# Patient Record
Sex: Female | Born: 1995 | Hispanic: No | Marital: Married | Smoking: Never smoker
Health system: Southern US, Community
[De-identification: ages and names within clinical notes are randomized; demographics above are authoritative.]

## PROBLEM LIST (undated history)

## (undated) ENCOUNTER — Inpatient Hospital Stay (HOSPITAL_COMMUNITY): Payer: Self-pay

## (undated) DIAGNOSIS — Z789 Other specified health status: Secondary | ICD-10-CM

## (undated) HISTORY — PX: NO PAST SURGERIES: SHX2092

---
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
## 2014-09-22 ENCOUNTER — Encounter (HOSPITAL_COMMUNITY): Payer: Self-pay | Admitting: Emergency Medicine

## 2014-11-21 NOTE — L&D Delivery Note (Cosign Needed)
Delivery Note Pt was noted to be C/C/+3 around 2300.  After about a 20 minute 2nd stage, with NICU in attendance, at 11:41 PM a viable female was delivered via Vaginal, Spontaneous Delivery (Presentation: LOA ).  APGAR: 9, 10; weight pending. After 2 minutes, the cord was clamped and cut. 40 units of pitocin diluted in 1000cc LR was infused rapidly IV.  The placenta separated spontaneously and delivered via CCT and maternal pushing effort.  It was inspected and appears to be intact with a 3 VC. To pathology d/t mec.   .     Anesthesia: Epidural  Episiotomy:  none Lacerations:  none Suture Repair: n/a Est. Blood Loss (mL):  50  Mom to postpartum.  Baby to Couplet care / Skin to Skin.  CRESENZO-DISHMAN,Najia Hurlbutt 11/12/2015, 11:53 PM

## 2015-05-18 LAB — OB RESULTS CONSOLE GC/CHLAMYDIA
Chlamydia: NEGATIVE
GC PROBE AMP, GENITAL: NEGATIVE

## 2015-05-18 LAB — OB RESULTS CONSOLE HIV ANTIBODY (ROUTINE TESTING): HIV: NONREACTIVE

## 2015-05-18 LAB — OB RESULTS CONSOLE VARICELLA ZOSTER ANTIBODY, IGG: Varicella: IMMUNE

## 2015-05-18 LAB — SICKLE CELL SCREEN: Sickle Cell Screen: NORMAL

## 2015-05-18 LAB — OB RESULTS CONSOLE ANTIBODY SCREEN: Antibody Screen: NEGATIVE

## 2015-05-18 LAB — OB RESULTS CONSOLE PLATELET COUNT: Platelets: 277 10*3/uL

## 2015-05-18 LAB — OB RESULTS CONSOLE ABO/RH: RH Type: POSITIVE

## 2015-05-18 LAB — OB RESULTS CONSOLE RPR: RPR: NONREACTIVE

## 2015-05-18 LAB — OB RESULTS CONSOLE HGB/HCT, BLOOD
HEMATOCRIT: 42 %
HEMOGLOBIN: 12.6 g/dL

## 2015-05-18 LAB — OB RESULTS CONSOLE HEPATITIS B SURFACE ANTIGEN: HEP B S AG: NEGATIVE

## 2015-05-18 LAB — CYSTIC FIBROSIS DIAGNOSTIC STUDY: Interpretation-CFDNA:: NEGATIVE

## 2015-05-18 LAB — OB RESULTS CONSOLE RUBELLA ANTIBODY, IGM: RUBELLA: IMMUNE

## 2015-06-02 ENCOUNTER — Encounter: Payer: Self-pay | Admitting: *Deleted

## 2015-06-04 ENCOUNTER — Encounter: Payer: Medicaid Other | Admitting: Family Medicine

## 2015-06-12 ENCOUNTER — Other Ambulatory Visit: Payer: Self-pay | Admitting: Obstetrics & Gynecology

## 2015-06-12 DIAGNOSIS — Z3A18 18 weeks gestation of pregnancy: Secondary | ICD-10-CM

## 2015-06-12 DIAGNOSIS — Z3689 Encounter for other specified antenatal screening: Secondary | ICD-10-CM

## 2015-06-12 DIAGNOSIS — O09212 Supervision of pregnancy with history of pre-term labor, second trimester: Secondary | ICD-10-CM

## 2015-06-15 ENCOUNTER — Other Ambulatory Visit: Payer: Self-pay | Admitting: Obstetrics & Gynecology

## 2015-06-15 ENCOUNTER — Encounter (HOSPITAL_COMMUNITY): Payer: Self-pay

## 2015-06-15 ENCOUNTER — Ambulatory Visit (HOSPITAL_COMMUNITY)
Admission: RE | Admit: 2015-06-15 | Discharge: 2015-06-15 | Disposition: A | Discharge status: Home / Self Care | Payer: Medicaid Other | Source: Ambulatory Visit | Attending: Obstetrics & Gynecology | Admitting: Obstetrics & Gynecology

## 2015-06-15 DIAGNOSIS — Z8751 Personal history of pre-term labor: Secondary | ICD-10-CM | POA: Insufficient documentation

## 2015-06-15 DIAGNOSIS — Z3A18 18 weeks gestation of pregnancy: Secondary | ICD-10-CM

## 2015-06-15 DIAGNOSIS — Z3689 Encounter for other specified antenatal screening: Secondary | ICD-10-CM

## 2015-06-15 DIAGNOSIS — O09212 Supervision of pregnancy with history of pre-term labor, second trimester: Secondary | ICD-10-CM | POA: Diagnosis present

## 2015-06-15 DIAGNOSIS — Z36 Encounter for antenatal screening of mother: Secondary | ICD-10-CM | POA: Insufficient documentation

## 2015-06-15 DIAGNOSIS — O09213 Supervision of pregnancy with history of pre-term labor, third trimester: Secondary | ICD-10-CM | POA: Insufficient documentation

## 2015-06-22 ENCOUNTER — Other Ambulatory Visit: Payer: Self-pay | Admitting: Obstetrics & Gynecology

## 2015-06-22 DIAGNOSIS — Z3403 Encounter for supervision of normal first pregnancy, third trimester: Secondary | ICD-10-CM

## 2015-06-22 DIAGNOSIS — O09213 Supervision of pregnancy with history of pre-term labor, third trimester: Secondary | ICD-10-CM

## 2015-06-22 DIAGNOSIS — Z3A20 20 weeks gestation of pregnancy: Secondary | ICD-10-CM

## 2015-06-22 DIAGNOSIS — Z3A18 18 weeks gestation of pregnancy: Secondary | ICD-10-CM

## 2015-06-22 DIAGNOSIS — O09293 Supervision of pregnancy with other poor reproductive or obstetric history, third trimester: Secondary | ICD-10-CM

## 2015-06-25 ENCOUNTER — Encounter: Payer: Self-pay | Admitting: Obstetrics & Gynecology

## 2015-06-25 ENCOUNTER — Ambulatory Visit (INDEPENDENT_AMBULATORY_CARE_PROVIDER_SITE_OTHER): Payer: Medicaid Other | Admitting: Obstetrics & Gynecology

## 2015-06-25 VITALS — BP 98/60 | HR 78 | Temp 97.9°F | Wt 111.9 lb

## 2015-06-25 DIAGNOSIS — O09212 Supervision of pregnancy with history of pre-term labor, second trimester: Secondary | ICD-10-CM

## 2015-06-25 LAB — POCT URINALYSIS DIP (DEVICE)
BILIRUBIN URINE: NEGATIVE
Glucose, UA: NEGATIVE mg/dL
Ketones, ur: NEGATIVE mg/dL
Leukocytes, UA: NEGATIVE
Nitrite: NEGATIVE
PH: 7 (ref 5.0–8.0)
Protein, ur: NEGATIVE mg/dL
Specific Gravity, Urine: 1.01 (ref 1.005–1.030)
UROBILINOGEN UA: 0.2 mg/dL (ref 0.0–1.0)

## 2015-06-25 MED ORDER — PRENATAL VITAMINS PLUS 27-1 MG PO TABS: 1.0000 | ORAL_TABLET | Freq: Every day | ORAL | Status: DC

## 2015-06-25 MED ORDER — HYDROXYPROGESTERONE CAPROATE 250 MG/ML IM OIL
250.0000 mg | TOPICAL_OIL | Freq: Once | INTRAMUSCULAR | Status: AC
  Administered 2015-07-09: 250 mg via INTRAMUSCULAR

## 2015-06-25 NOTE — Patient Instructions (Signed)
Preterm Birth °Preterm birth is a birth that happens before 37 weeks of pregnancy. Most pregnancies last about 39-41 weeks. Every week in the womb is important and is beneficial to the health of the infant. Infants born before 37 weeks of pregnancy are at a higher risk for complications. Depending on when the infant was born, he or she may be: °· Late preterm. Born between 32 weeks and 37 weeks of pregnancy. °· Very preterm. Born at less than 32 weeks of pregnancy. °· Extremely preterm. Born at less than 25 weeks of pregnancy. °The earlier a baby is born, the more likely the child will have issues related to prematurity. Complications and problems that can be seen in infants born too early include: °· Problems breathing (respiratory distress syndrome). °· Low birth weight. °· Problems feeding. °· Sleeping problems. °· Yellowing of the skin (jaundice). °· Infections such as pneumonia.  °Babies born very preterm or extremely preterm are at risk for more serious medical issues. These include: °· More severe breathing issues. °· Eyesight issues. °· Brain development issues (intraventricular hemorrhage). °· Behavioral and emotional development issues. °· Growth and developmental delays. °· Cerebral palsy. °· Serious feeding or bowel complications (necrotizing enterocolitis). °CAUSES  °There are two broad categories of preterm birth. °· Spontaneous preterm birth. This is a birth resulting from preterm labor (not medically induced) or preterm premature rupture of membranes (PPROM). °· Indicated preterm birth. This is a birth resulting from labor being medically induced due to health, personal, or social reasons. °RISK FACTORS °Preterm birth may be related to certain medical conditions, lifestyle factors, or demographic factors encountered by the mother or fetus. °· Medical conditions include: °¨ Multiple gestations (twins, triplets, and so on). °¨ Infection. °¨ Diabetes. °¨ Heart disease. °¨ Kidney disease. °¨ Cervical or  uterine abnormalities. °¨ Being underweight. °¨ High blood pressure or preeclampsia. °¨ Premature rupture of membranes (PROM). °¨ Birth defects in the fetus. °· Lifestyle factors include: °¨ Poor prenatal care. °¨ Poor nutrition or anemia. °¨ Cigarette smoking. °¨ Consuming alcohol. °¨ High levels of stress and lack of social or emotional support. °¨ Exposure to chemical or environmental toxins. °¨ Substance abuse. °· Demographic factors include: °¨ African-American ethnicity. °¨ Age (younger than 18 or older than 20 years of age). °¨ Low socioeconomic status. °Women with a history of preterm labor or who become pregnant within 18 months of giving birth are also at increased risk for preterm birth. °DIAGNOSIS  °Your health care provider may request additional tests to diagnose underlying complications resulting from preterm birth. Tests on the infant may include: °· Physical exam. °· Blood tests. °· Chest X-rays. °· Heart-lung monitoring. °TREATMENT  °After birth, special care will be taken to assess any problems or complications for the infant. Supportive care will be provided for the infant. Treatment depends on what problems are present and any complications that develop. Some preterm infants are cared for in a neonatal intensive care unit. In general, care may include: °· Maintaining temperature and oxygen in a clear heated box (baby isolette). °· Monitoring the infant's heart rate, breathing, and level of oxygen in the blood. °· Monitoring for signs of infection and, if needed, giving IV antibiotic medicine. °· Inserting a feeding tube (nose, mouth) or giving IV nutrition if unable to feed. °· Inserting a breathing tube (ventilation). °· Respiration support (continuous positive airway pressure [CPAP] or oxygen).  °Treatment will change as the infant builds up strength and is able to breathe and eat on his or her   own. For some infants, no special treatment is necessary. Parents may be educated on the potential  health risks of prematurity to the infant. °HOME CARE INSTRUCTIONS °· Understand your infant's special conditions and needs. It may be reassuring to learn about infant CPR. °· Monitor your infant in the car seat until he or she grows and matures. Infant car seats can cause breathing difficulties for preterm infants. °· Keep your infant warm. Dress your infant in layers and keep him or her away from drafts, especially in cold months of the year. °· Wash your hands thoroughly after going to the bathroom or changing a diaper. Late preterm infants may be more prone to infection. °· Follow all your health care provider's instructions for providing support and care to your preterm infant. °· Get support from organizations and groups that understand your challenges. °· Follow up with your infant's health care provider as directed. °Prevention °There are some things you can do to help lower your risk of having a preterm infant in the future. These include: °· Good prenatal care throughout the entire pregnancy. See a health care provider regularly for advice and tests. °· Management of underlying medical conditions. °· Proper self-care and lifestyle changes. °· Proper diet and weight control. °· Watching for signs of various infections. °SEEK MEDICAL CARE IF: °· Your infant has feeding difficulties. °· Your infant has sleeping difficulties. °· Your infant has breathing difficulties. °· Your infant's skin starts to look yellow. °· Your infant shows signs of infection, such as a stuffy nose, fever, crying, or bluish color of the skin. °FOR MORE INFORMATION °[employer] of [employer]: www.marchofdimes.com °Prematurity.org: www.prematurity.org °Document Released: 01/28/2004 Document Revised: 08/28/2013 Document Reviewed: 06/06/2013 °ExitCare® Patient Information ©2015 [employer]. This information is not intended to replace advice given to you by your health care provider. Make sure you discuss any questions you have with your health  care provider. ° °

## 2015-06-25 NOTE — Progress Notes (Signed)
Here for first visit, transferring from health department. Used Copywriter, advertising.  Given new patient booklet.

## 2015-06-25 NOTE — Progress Notes (Signed)
Nutrition Note: 1st visit.  Wt gain wnl.  Diet appears adequate.  PNV daily. Reports 3 meals and 2-3 snacks/day.  Drinks water, juice, and milk.   No N/V reported. Plans to BF. Receives [facility]. Verbal general nutrition education for pregnancy given through interpreter. F/U as needed. Candice C. Earlene Plater, MPH, RD, LDN

## 2015-06-25 NOTE — Progress Notes (Signed)
   Subjective:transfer from HD    [name] is a G2P0101 [redacted]w[redacted]d being seen today for her first obstetrical visit.  Her obstetrical history is significant for preterm delivery 2012 33 weeks. Patient does intend to breast feed. Pregnancy history fully reviewed.  Patient reports no complaints.  Filed Vitals:   06/25/15 0801  BP: 98/60  Pulse: 78  Temp: 97.9 F (36.6 C)  Weight: 111 lb 14.4 oz (50.758 kg)    HISTORY: OB History  Gravida Para Term Preterm AB SAB TAB Ectopic Multiple Living     # Outcome Date GA Lbr Len/2nd Weight Sex Delivery Anes PTL Lv  2 Current           1 Preterm 07/06/11 [redacted]w[redacted]d 42:40 / 01:52  F Vag-Spont None  Y     Past Medical History  Diagnosis Date  . No pertinent past medical history    Past Surgical History  Procedure Laterality Date  . No past surgeries     History reviewed. No pertinent family history.   Exam    Uterus:     Pelvic Exam:                                    Skin: normal coloration and turgor, no rashes    Neurologic: oriented, normal, normal mood   Extremities: normal strength, tone, and muscle mass       Mouth/Teeth dental hygiene good   Neck supple and no masses   Cardiovascular: regular rate and rhythm   Respiratory:  appears well, vitals normal, no respiratory distress, acyanotic, normal RR   Abdomen: soft, non-tender; bowel sounds normal; no masses,  no organomegaly          Assessment:    Pregnancy: [id number] Patient Active Problem List   Diagnosis Date Noted  . [redacted] weeks gestation of pregnancy   . Previous preterm delivery in second trimester, antepartum   Candidate for 17 P injections, this was explained via interpreter       Plan:     Initial labs reviewed Prenatal vitamins. Problem list reviewed and updated. Genetic Screening discussed Quad Screen: results reviewed.normal  Ultrasound discussed; fetal survey: ordered.  Follow up in 4 weeks. 50% of 30 min visit spent on  counseling and coordination of care.  17 P paperwork, see SW re transportation problems   Melecio Cueto 06/25/2015

## 2015-06-26 ENCOUNTER — Encounter: Payer: Self-pay | Admitting: *Deleted

## 2015-06-26 NOTE — Progress Notes (Signed)
FMLA completed for spouse to bring patient to appointments. Message left at patient's home informing her that paperwork is completed and ready for pick up.

## 2015-06-30 ENCOUNTER — Ambulatory Visit (HOSPITAL_COMMUNITY)
Admission: RE | Admit: 2015-06-30 | Discharge: 2015-06-30 | Disposition: A | Discharge status: Home / Self Care | Payer: Medicaid Other | Source: Ambulatory Visit | Attending: Obstetrics & Gynecology | Admitting: Obstetrics & Gynecology

## 2015-06-30 ENCOUNTER — Other Ambulatory Visit: Payer: Self-pay | Admitting: Obstetrics & Gynecology

## 2015-06-30 ENCOUNTER — Encounter (HOSPITAL_COMMUNITY): Payer: Self-pay

## 2015-06-30 DIAGNOSIS — O09213 Supervision of pregnancy with history of pre-term labor, third trimester: Secondary | ICD-10-CM

## 2015-06-30 DIAGNOSIS — Z3A18 18 weeks gestation of pregnancy: Secondary | ICD-10-CM

## 2015-06-30 DIAGNOSIS — O09293 Supervision of pregnancy with other poor reproductive or obstetric history, third trimester: Secondary | ICD-10-CM | POA: Diagnosis not present

## 2015-06-30 DIAGNOSIS — Z3403 Encounter for supervision of normal first pregnancy, third trimester: Secondary | ICD-10-CM

## 2015-07-02 ENCOUNTER — Ambulatory Visit: Payer: Medicaid Other

## 2015-07-09 ENCOUNTER — Ambulatory Visit (INDEPENDENT_AMBULATORY_CARE_PROVIDER_SITE_OTHER): Payer: Medicaid Other

## 2015-07-09 VITALS — BP 95/59 | HR 80 | Temp 97.7°F | Resp 16 | Wt 112.2 lb

## 2015-07-09 DIAGNOSIS — O09892 Supervision of other high risk pregnancies, second trimester: Secondary | ICD-10-CM

## 2015-07-09 DIAGNOSIS — O09212 Supervision of pregnancy with history of pre-term labor, second trimester: Secondary | ICD-10-CM

## 2015-07-13 ENCOUNTER — Other Ambulatory Visit: Payer: Self-pay | Admitting: Obstetrics & Gynecology

## 2015-07-13 ENCOUNTER — Encounter (HOSPITAL_COMMUNITY): Payer: Self-pay

## 2015-07-13 ENCOUNTER — Ambulatory Visit (HOSPITAL_COMMUNITY)
Admission: RE | Admit: 2015-07-13 | Discharge: 2015-07-13 | Disposition: A | Discharge status: Home / Self Care | Payer: Medicaid Other | Source: Ambulatory Visit | Attending: Obstetrics & Gynecology | Admitting: Obstetrics & Gynecology

## 2015-07-13 VITALS — BP 98/65 | HR 100 | Wt 116.6 lb

## 2015-07-13 DIAGNOSIS — O09293 Supervision of pregnancy with other poor reproductive or obstetric history, third trimester: Secondary | ICD-10-CM

## 2015-07-13 DIAGNOSIS — Z3A20 20 weeks gestation of pregnancy: Secondary | ICD-10-CM | POA: Diagnosis not present

## 2015-07-13 DIAGNOSIS — O09212 Supervision of pregnancy with history of pre-term labor, second trimester: Secondary | ICD-10-CM

## 2015-07-13 DIAGNOSIS — IMO0002 Reserved for concepts with insufficient information to code with codable children: Secondary | ICD-10-CM

## 2015-07-13 DIAGNOSIS — Z3403 Encounter for supervision of normal first pregnancy, third trimester: Secondary | ICD-10-CM

## 2015-07-13 DIAGNOSIS — Z0489 Encounter for examination and observation for other specified reasons: Secondary | ICD-10-CM

## 2015-07-13 DIAGNOSIS — Z36 Encounter for antenatal screening of mother: Secondary | ICD-10-CM | POA: Insufficient documentation

## 2015-07-13 DIAGNOSIS — O09213 Supervision of pregnancy with history of pre-term labor, third trimester: Secondary | ICD-10-CM

## 2015-07-16 ENCOUNTER — Ambulatory Visit: Payer: Medicaid Other | Admitting: *Deleted

## 2015-07-16 DIAGNOSIS — Z3490 Encounter for supervision of normal pregnancy, unspecified, unspecified trimester: Secondary | ICD-10-CM

## 2015-07-16 MED ORDER — HYDROXYPROGESTERONE CAPROATE 250 MG/ML IM OIL
250.0000 mg | TOPICAL_OIL | Freq: Once | INTRAMUSCULAR | Status: AC
  Administered 2015-07-23: 250 mg via INTRAMUSCULAR

## 2015-07-23 ENCOUNTER — Ambulatory Visit (INDEPENDENT_AMBULATORY_CARE_PROVIDER_SITE_OTHER): Payer: Medicaid Other | Admitting: Family Medicine

## 2015-07-23 VITALS — BP 94/59 | HR 77 | Temp 98.0°F | Wt 118.2 lb

## 2015-07-23 DIAGNOSIS — O09212 Supervision of pregnancy with history of pre-term labor, second trimester: Secondary | ICD-10-CM | POA: Diagnosis not present

## 2015-07-23 DIAGNOSIS — O0992 Supervision of high risk pregnancy, unspecified, second trimester: Secondary | ICD-10-CM | POA: Diagnosis not present

## 2015-07-23 DIAGNOSIS — Z23 Encounter for immunization: Secondary | ICD-10-CM

## 2015-07-23 DIAGNOSIS — O0990 Supervision of high risk pregnancy, unspecified, unspecified trimester: Secondary | ICD-10-CM | POA: Insufficient documentation

## 2015-07-23 LAB — POCT URINALYSIS DIP (DEVICE)
BILIRUBIN URINE: NEGATIVE
Glucose, UA: NEGATIVE mg/dL
HGB URINE DIPSTICK: NEGATIVE
KETONES UR: NEGATIVE mg/dL
LEUKOCYTES UA: NEGATIVE
NITRITE: NEGATIVE
Protein, ur: NEGATIVE mg/dL
Specific Gravity, Urine: 1.015 (ref 1.005–1.030)
Urobilinogen, UA: 0.2 mg/dL (ref 0.0–1.0)
pH: 7.5 (ref 5.0–8.0)

## 2015-07-23 MED ORDER — HYDROXYPROGESTERONE CAPROATE 250 MG/ML IM OIL: 250.0000 mg | TOPICAL_OIL | Freq: Once | INTRAMUSCULAR | Status: DC

## 2015-07-23 NOTE — Progress Notes (Signed)
Subjective:  [name] is a 19 y.o. G2P0101 at [redacted]w[redacted]d being seen today for ongoing prenatal care.  Patient reports no complaints.  Contractions: Not present.  Vag. Bleeding: None. Movement: Present. Denies leaking of fluid.   The following portions of the patient's history were reviewed and updated as appropriate: allergies, current medications, past family history, past medical history, past social history, past surgical history and problem list.   Objective:   Filed Vitals:   07/23/15 0935  BP: 94/59  Pulse: 77  Temp: 98 F (36.7 C)  Weight: 118 lb 3.2 oz (53.615 kg)    Fetal Status: Fetal Heart Rate (bpm): 150   Movement: Present     General:  Alert, oriented and cooperative. Patient is in no acute distress.  Skin: Skin is warm and dry. No rash noted.   Cardiovascular: Normal heart rate noted  Respiratory: Normal respiratory effort, no problems with respiration noted  Abdomen: Soft, gravid, appropriate for gestational age. Pain/Pressure: Absent     Pelvic: Vag. Bleeding: None     Cervical exam deferred        Extremities: Normal range of motion.  Edema: None  Mental Status: Normal mood and affect. Normal behavior. Normal judgment and thought content.   Urinalysis:      Assessment and Plan:  Pregnancy: G2P0101 at [redacted]w[redacted]d  1. Previous preterm delivery in second trimester, antepartum Continue Makena - Flu Vaccine QUAD 36+ mos IM; Standing - Flu Vaccine QUAD 36+ mos IM  2. Needs flu shot - Flu Vaccine QUAD 36+ mos IM; Standing - Flu Vaccine QUAD 36+ mos IM  3. Supervision of high risk pregnancy, antepartum, second trimester FHT and FH normal  Preterm labor symptoms and general obstetric precautions including but not limited to vaginal bleeding, contractions, leaking of fluid and fetal movement were reviewed in detail with the patient. Please refer to After Visit Summary for other counseling recommendations.  No Follow-up on file.   Levie Heritage, DO

## 2015-07-23 NOTE — Patient Instructions (Signed)
Second Trimester of Pregnancy The second trimester is from week 13 through week 28, month 4 through 6. This is often the time in pregnancy that you feel your best. Often times, morning sickness has lessened or quit. You may have more energy, and you may get hungry more often. Your unborn baby (fetus) is growing rapidly. At the end of the sixth month, he or she is about 9 inches long and weighs about 1 pounds. You will likely feel the baby move (quickening) between 18 and 20 weeks of pregnancy. HOME CARE   Avoid all smoking, herbs, and alcohol. Avoid drugs not approved by your doctor.  Only take medicine as told by your doctor. Some medicines are safe and some are not during pregnancy.  Exercise only as told by your doctor. Stop exercising if you start having cramps.  Eat regular, healthy meals.  Wear a good support bra if your breasts are tender.  Do not use hot tubs, steam rooms, or saunas.  Wear your seat belt when driving.  Avoid raw meat, uncooked cheese, and liter boxes and soil used by cats.  Take your prenatal vitamins.  Try taking medicine that helps you poop (stool softener) as needed, and if your doctor approves. Eat more fiber by eating fresh fruit, vegetables, and whole grains. Drink enough fluids to keep your pee (urine) clear or pale yellow.  Take warm water baths (sitz baths) to soothe pain or discomfort caused by hemorrhoids. Use hemorrhoid cream if your doctor approves.  If you have puffy, bulging veins (varicose veins), wear support hose. Raise (elevate) your feet for 15 minutes, 3-4 times a day. Limit salt in your diet.  Avoid heavy lifting, wear low heals, and sit up straight.  Rest with your legs raised if you have leg cramps or low back pain.  Visit your dentist if you have not gone during your pregnancy. Use a soft toothbrush to brush your teeth. Be gentle when you floss.  You can have sex (intercourse) unless your doctor tells you not to.  Go to your  doctor visits. GET HELP IF:   You feel dizzy.  You have mild cramps or pressure in your lower belly (abdomen).  You have a nagging pain in your belly area.  You continue to feel sick to your stomach (nauseous), throw up (vomit), or have watery poop (diarrhea).  You have bad smelling fluid coming from your vagina.  You have pain with peeing (urination). GET HELP RIGHT AWAY IF:   You have a fever.  You are leaking fluid from your vagina.  You have spotting or bleeding from your vagina.  You have severe belly cramping or pain.  You lose or gain weight rapidly.  You have trouble catching your breath and have chest pain.  You notice sudden or extreme puffiness (swelling) of your face, hands, ankles, feet, or legs.  You have not felt the baby move in over an hour.  You have severe headaches that do not go away with medicine.  You have vision changes. Document Released: 02/01/2010 Document Revised: 03/04/2013 Document Reviewed: 01/08/2013 ExitCare Patient Information 2015 ExitCare, [employer]. This information is not intended to replace advice given to you by your health care provider. Make sure you discuss any questions you have with your health care provider.  

## 2015-07-28 ENCOUNTER — Other Ambulatory Visit (HOSPITAL_COMMUNITY): Payer: Self-pay | Admitting: Maternal and Fetal Medicine

## 2015-07-28 ENCOUNTER — Ambulatory Visit (HOSPITAL_COMMUNITY)
Admission: RE | Admit: 2015-07-28 | Discharge: 2015-07-28 | Disposition: A | Discharge status: Home / Self Care | Payer: Medicaid Other | Source: Ambulatory Visit | Attending: Obstetrics & Gynecology | Admitting: Obstetrics & Gynecology

## 2015-07-28 ENCOUNTER — Encounter (HOSPITAL_COMMUNITY): Payer: Self-pay

## 2015-07-28 DIAGNOSIS — Z3A22 22 weeks gestation of pregnancy: Secondary | ICD-10-CM

## 2015-07-28 DIAGNOSIS — O09212 Supervision of pregnancy with history of pre-term labor, second trimester: Secondary | ICD-10-CM | POA: Diagnosis not present

## 2015-07-30 ENCOUNTER — Ambulatory Visit: Payer: Medicaid Other

## 2015-07-30 DIAGNOSIS — O09212 Supervision of pregnancy with history of pre-term labor, second trimester: Secondary | ICD-10-CM

## 2015-07-30 MED ORDER — HYDROXYPROGESTERONE CAPROATE 250 MG/ML IM OIL
250.0000 mg | TOPICAL_OIL | INTRAMUSCULAR | Status: AC
  Administered 2015-08-06 – 2015-10-26 (×10): 250 mg via INTRAMUSCULAR

## 2015-07-30 NOTE — Progress Notes (Signed)
Contacted Jearld Lesch from [employer], for a refill on 17p.  Was informed medication will be sent on Friday or Monday.  Will contact pt when medication arrives to the office.  Pt requests to be called @ [phone].

## 2015-07-31 ENCOUNTER — Telehealth: Payer: Self-pay | Admitting: General Practice

## 2015-07-31 NOTE — Telephone Encounter (Signed)
Telephone call to patient regarding arrival of 17p. Called patient with [employer] interpreter [phone] and informed patient of arrival 17p and appt next Thursday. Patient verbalized understanding and had no questions

## 2015-08-06 ENCOUNTER — Ambulatory Visit (INDEPENDENT_AMBULATORY_CARE_PROVIDER_SITE_OTHER): Payer: Medicaid Other | Admitting: *Deleted

## 2015-08-06 DIAGNOSIS — O09212 Supervision of pregnancy with history of pre-term labor, second trimester: Secondary | ICD-10-CM

## 2015-08-10 ENCOUNTER — Ambulatory Visit (HOSPITAL_COMMUNITY)
Admission: RE | Admit: 2015-08-10 | Discharge: 2015-08-10 | Disposition: A | Discharge status: Home / Self Care | Payer: Medicaid Other | Source: Ambulatory Visit | Attending: Obstetrics & Gynecology | Admitting: Obstetrics & Gynecology

## 2015-08-10 ENCOUNTER — Other Ambulatory Visit (HOSPITAL_COMMUNITY): Payer: Self-pay | Admitting: Maternal and Fetal Medicine

## 2015-08-10 ENCOUNTER — Encounter (HOSPITAL_COMMUNITY): Payer: Self-pay

## 2015-08-10 DIAGNOSIS — Z3A24 24 weeks gestation of pregnancy: Secondary | ICD-10-CM | POA: Insufficient documentation

## 2015-08-10 DIAGNOSIS — O09212 Supervision of pregnancy with history of pre-term labor, second trimester: Secondary | ICD-10-CM | POA: Diagnosis not present

## 2015-08-13 ENCOUNTER — Ambulatory Visit (INDEPENDENT_AMBULATORY_CARE_PROVIDER_SITE_OTHER): Payer: Medicaid Other

## 2015-08-13 VITALS — BP 115/75 | HR 67 | Wt 123.5 lb

## 2015-08-13 DIAGNOSIS — O09212 Supervision of pregnancy with history of pre-term labor, second trimester: Secondary | ICD-10-CM | POA: Diagnosis present

## 2015-08-13 MED ORDER — PRENATAL VITAMINS PLUS 27-1 MG PO TABS: 1.0000 | ORAL_TABLET | Freq: Every day | ORAL | Status: DC

## 2015-08-20 ENCOUNTER — Ambulatory Visit (INDEPENDENT_AMBULATORY_CARE_PROVIDER_SITE_OTHER): Payer: Medicaid Other | Admitting: Obstetrics & Gynecology

## 2015-08-20 VITALS — BP 96/66 | HR 101 | Temp 97.8°F | Wt 123.2 lb

## 2015-08-20 DIAGNOSIS — O09212 Supervision of pregnancy with history of pre-term labor, second trimester: Secondary | ICD-10-CM

## 2015-08-20 DIAGNOSIS — O0992 Supervision of high risk pregnancy, unspecified, second trimester: Secondary | ICD-10-CM | POA: Diagnosis not present

## 2015-08-20 LAB — POCT URINALYSIS DIP (DEVICE)
BILIRUBIN URINE: NEGATIVE
GLUCOSE, UA: NEGATIVE mg/dL
HGB URINE DIPSTICK: NEGATIVE
KETONES UR: NEGATIVE mg/dL
LEUKOCYTES UA: NEGATIVE
Nitrite: NEGATIVE
Protein, ur: NEGATIVE mg/dL
SPECIFIC GRAVITY, URINE: 1.015 (ref 1.005–1.030)
UROBILINOGEN UA: 0.2 mg/dL (ref 0.0–1.0)
pH: 7 (ref 5.0–8.0)

## 2015-08-20 NOTE — Patient Instructions (Signed)
Preterm Labor Information °Preterm labor is when labor starts at less than 37 weeks of pregnancy. The normal length of a pregnancy is 39 to 41 weeks. °CAUSES °Often, there is no identifiable underlying cause as to why a woman goes into preterm labor. One of the most common known causes of preterm labor is infection. Infections of the uterus, cervix, vagina, amniotic sac, bladder, kidney, or even the lungs (pneumonia) can cause labor to start. Other suspected causes of preterm labor include:  °· Urogenital infections, such as yeast infections and bacterial vaginosis.   °· Uterine abnormalities (uterine shape, uterine septum, fibroids, or bleeding from the placenta).   °· A cervix that has been operated on (it may fail to stay closed).   °· Malformations in the fetus.   °· Multiple gestations (twins, triplets, and so on).   °· Breakage of the amniotic sac.   °RISK FACTORS °· Having a previous history of preterm labor.   °· Having premature rupture of membranes (PROM).   °· Having a placenta that covers the opening of the cervix (placenta previa).   °· Having a placenta that separates from the uterus (placental abruption).   °· Having a cervix that is too weak to hold the fetus in the uterus (incompetent cervix).   °· Having too much fluid in the amniotic sac (polyhydramnios).   °· Taking illegal drugs or smoking while pregnant.   °· Not gaining enough weight while pregnant.   °· Being younger than 18 and older than 20 years old.   °· Having a low socioeconomic status.   °· Being African American. °SYMPTOMS °Signs and symptoms of preterm labor include:  °· Menstrual-like cramps, abdominal pain, or back pain. °· Uterine contractions that are regular, as frequent as six in an hour, regardless of their intensity (may be mild or painful). °· Contractions that start on the top of the uterus and spread down to the lower abdomen and back.   °· A sense of increased pelvic pressure.   °· A watery or bloody mucus discharge that  comes from the vagina.   °TREATMENT °Depending on the length of the pregnancy and other circumstances, your health care [name] may suggest bed rest. If necessary, there are medicines that can be given to stop contractions and to mature the fetal lungs. If labor happens before 34 weeks of pregnancy, a prolonged hospital stay may be recommended. Treatment depends on the condition of both you and the fetus.  °WHAT SHOULD YOU DO IF YOU THINK YOU ARE IN PRETERM LABOR? °Call your health care [name] right away. You will need to go to the hospital to get checked immediately. °HOW CAN YOU PREVENT PRETERM LABOR IN FUTURE PREGNANCIES? °You should:  °· Stop smoking if you smoke.  °· Maintain healthy weight gain and avoid chemicals and drugs that are not necessary. °· Be watchful for any type of infection. °· Inform your health care [name] if you have a known history of preterm labor. °Document Released: 01/28/2004 Document Revised: 07/10/2013 Document Reviewed: 12/10/2012 °ExitCare® Patient Information ©2015 [employer]. This information is not intended to replace advice given to you by your health care [name]. Make sure you discuss any questions you have with your health care [name]. ° °

## 2015-08-20 NOTE — Progress Notes (Signed)
Subjective:  [name] is a 19 y.o. G2P0101 at [redacted]w[redacted]d being seen today for ongoing prenatal care.  Patient reports no complaints.  Contractions: Not present.  Vag. Bleeding: None. Movement: Present. Denies leaking of fluid.   The following portions of the patient's history were reviewed and updated as appropriate: allergies, current medications, past family history, past medical history, past social history, past surgical history and problem list.   Objective:   Filed Vitals:   08/20/15 0912  BP: 96/66  Pulse: 101  Temp: 97.8 F (36.6 C)  Weight: 123 lb 3.2 oz (55.883 kg)    Fetal Status: Fetal Heart Rate (bpm): 138   Movement: Present     General:  Alert, oriented and cooperative. Patient is in no acute distress.  Skin: Skin is warm and dry. No rash noted.   Cardiovascular: Normal heart rate noted  Respiratory: Normal respiratory effort, no problems with respiration noted  Abdomen: Soft, gravid, appropriate for gestational age. Pain/Pressure: Absent     Pelvic: Vag. Bleeding: None     Cervical exam deferred        Extremities: Normal range of motion.  Edema: None  Mental Status: Normal mood and affect. Normal behavior. Normal judgment and thought content.   Urinalysis:      Assessment and Plan:  Pregnancy: G2P0101 at [redacted]w[redacted]d  1. Previous preterm delivery in second trimester, antepartum 17 p injection today  2. Supervision of high risk pregnancy, antepartum, second trimester Needs 1 hr GTT soon  Preterm labor symptoms and general obstetric precautions including but not limited to vaginal bleeding, contractions, leaking of fluid and fetal movement were reviewed in detail with the patient. Please refer to After Visit Summary for other counseling recommendations.  Return in about 7 days (around 08/27/2015) for 1 hour gtt/28wk labs/Tdap/17 P injection .   Adam Phenix, MD

## 2015-08-20 NOTE — Progress Notes (Signed)
Breastfeeding tip of the week reviewed Offered 28 wk labs with 1 hour/can not do today 17P injection

## 2015-08-27 ENCOUNTER — Other Ambulatory Visit (INDEPENDENT_AMBULATORY_CARE_PROVIDER_SITE_OTHER): Payer: Medicaid Other | Admitting: *Deleted

## 2015-08-27 ENCOUNTER — Encounter: Payer: Self-pay | Admitting: Family Medicine

## 2015-08-27 DIAGNOSIS — O0992 Supervision of high risk pregnancy, unspecified, second trimester: Secondary | ICD-10-CM

## 2015-08-27 DIAGNOSIS — Z23 Encounter for immunization: Secondary | ICD-10-CM | POA: Diagnosis not present

## 2015-08-27 DIAGNOSIS — Z3493 Encounter for supervision of normal pregnancy, unspecified, third trimester: Secondary | ICD-10-CM

## 2015-08-27 LAB — CBC
HCT: 35.1 % — ABNORMAL LOW (ref 36.0–46.0)
Hemoglobin: 11.6 g/dL — ABNORMAL LOW (ref 12.0–15.0)
MCH: 26.4 pg (ref 26.0–34.0)
MCHC: 33 g/dL (ref 30.0–36.0)
MCV: 80 fL (ref 78.0–100.0)
MPV: 9.8 fL (ref 8.6–12.4)
PLATELETS: 266 10*3/uL (ref 150–400)
RBC: 4.39 MIL/uL (ref 3.87–5.11)
RDW: 14.2 % (ref 11.5–15.5)
WBC: 8.5 10*3/uL (ref 4.0–10.5)

## 2015-08-27 MED ORDER — TETANUS-DIPHTH-ACELL PERTUSSIS 5-2.5-18.5 LF-MCG/0.5 IM SUSP
0.5000 mL | Freq: Once | INTRAMUSCULAR | Status: AC
  Administered 2015-08-27: 0.5 mL via INTRAMUSCULAR

## 2015-08-28 LAB — HIV ANTIBODY (ROUTINE TESTING W REFLEX): HIV 1&2 Ab, 4th Generation: NONREACTIVE

## 2015-08-28 LAB — RPR

## 2015-08-28 LAB — GLUCOSE TOLERANCE, 1 HOUR (50G) W/O FASTING: GLUCOSE 1 HOUR GTT: 152 mg/dL — AB (ref 70–140)

## 2015-08-31 ENCOUNTER — Telehealth: Payer: Self-pay | Admitting: *Deleted

## 2015-08-31 NOTE — Telephone Encounter (Signed)
[employer]preter [phone]. Informed patient that she needs 3 hour gtt on Thursday 09/03/15.  Pt verbalizes understanding.

## 2015-08-31 NOTE — Telephone Encounter (Addendum)
Called patient using language line [name] interpreter. No answer, left vm to have patient call us back. Pt needs to have 3 hr gtt.

## 2015-09-03 ENCOUNTER — Ambulatory Visit (INDEPENDENT_AMBULATORY_CARE_PROVIDER_SITE_OTHER): Payer: Medicaid Other | Admitting: Family Medicine

## 2015-09-03 VITALS — BP 103/58 | HR 96 | Temp 98.6°F | Wt 126.0 lb

## 2015-09-03 DIAGNOSIS — O0992 Supervision of high risk pregnancy, unspecified, second trimester: Secondary | ICD-10-CM

## 2015-09-03 DIAGNOSIS — O09212 Supervision of pregnancy with history of pre-term labor, second trimester: Secondary | ICD-10-CM

## 2015-09-03 LAB — POCT URINALYSIS DIP (DEVICE)
BILIRUBIN URINE: NEGATIVE
Glucose, UA: NEGATIVE mg/dL
Hgb urine dipstick: NEGATIVE
Ketones, ur: NEGATIVE mg/dL
Leukocytes, UA: NEGATIVE
NITRITE: NEGATIVE
PH: 7.5 (ref 5.0–8.0)
PROTEIN: NEGATIVE mg/dL
Specific Gravity, Urine: 1.015 (ref 1.005–1.030)
Urobilinogen, UA: 0.2 mg/dL (ref 0.0–1.0)

## 2015-09-03 NOTE — Addendum Note (Signed)
Addended by: Darrel HooverASSETTE, KELLY P on: 09/03/2015 11:59 AM   Modules accepted: Orders

## 2015-09-03 NOTE — Addendum Note (Signed)
Addended by: Cheree DittoGRAHAM, DEMETRICE A on: 09/03/2015 10:56 AM   Modules accepted: Orders

## 2015-09-03 NOTE — Progress Notes (Signed)
Subjective:  [name] is a 19 y.o. G2P0101 at 1279w0d being seen today for ongoing prenatal care.  Patient reports no complaints.   .  Vag. Bleeding: None. Movement: Present. Denies leaking of fluid.   The following portions of the patient's history were reviewed and updated as appropriate: allergies, current medications, past family history, past medical history, past social history, past surgical history and problem list. Problem list updated.  Objective:   Filed Vitals:   09/03/15 0912  BP: 103/58  Pulse: 96  Temp: 98.6 F (37 C)  Weight: 126 lb (57.153 kg)    Fetal Status: Fetal Heart Rate (bpm): 146   Movement: Present     General:  Alert, oriented and cooperative. Patient is in no acute distress.  Skin: Skin is warm and dry. No rash noted.   Cardiovascular: Normal heart rate noted  Respiratory: Normal respiratory effort, no problems with respiration noted  Abdomen: Soft, gravid, appropriate for gestational age. Pain/Pressure: Absent     Pelvic: Vag. Bleeding: None     Cervical exam deferred        Extremities: Normal range of motion.  Edema: None  Mental Status: Normal mood and affect. Normal behavior. Normal judgment and thought content.   Urinalysis:      Assessment and Plan:  Pregnancy: G2P0101 at 9879w0d  1. Supervision of high risk pregnancy, antepartum, second trimester Normal FHT and fundal height  2. Previous preterm delivery in second trimester, antepartum Continue 17-P  Preterm labor symptoms and general obstetric precautions including but not limited to vaginal bleeding, contractions, leaking of fluid and fetal movement were reviewed in detail with the patient. Please refer to After Visit Summary for other counseling recommendations.  No Follow-up on file.   Levie HeritageJacob J Stinson, DO

## 2015-09-03 NOTE — Patient Instructions (Signed)
Preterm Labor Information °Preterm labor is when labor starts before you are [redacted] weeks pregnant. The normal length of pregnancy is 39 to 41 weeks.  °CAUSES  °The cause of preterm labor is not often known. The most common known cause is infection. °RISK FACTORS °· Having a history of preterm labor. °· Having your water break before it should. °· Having a placenta that covers the opening of the cervix. °· Having a placenta that breaks away from the uterus. °· Having a cervix that is too weak to hold the baby in the uterus. °· Having too much fluid in the amniotic sac. °· Taking drugs or smoking while pregnant. °· Not gaining enough weight while pregnant. °· Being younger than 18 and older than 20 years old. °· Having a low income. °· Being African American. °SYMPTOMS °· Period-like cramps, belly (abdominal) pain, or back pain. °· Contractions that are regular, as often as six in an hour. They may be mild or painful. °· Contractions that start at the top of the belly. They then move to the lower belly and back. °· Lower belly pressure that seems to get stronger. °· Bleeding from the vagina. °· Fluid leaking from the vagina. °TREATMENT  °Treatment depends on: °· Your condition. °· The condition of your baby. °· How many weeks pregnant you are. °Your doctor may have you: °· Take medicine to stop contractions. °· Stay in bed except to use the restroom (bed rest). °· Stay in the hospital. °WHAT SHOULD YOU DO IF YOU THINK YOU ARE IN PRETERM LABOR? °Call your doctor right away. You need to go to the hospital right away.  °HOW CAN YOU PREVENT PRETERM LABOR IN FUTURE PREGNANCIES? °· Stop smoking, if you smoke. °· Maintain healthy weight gain. °· Do not take drugs or be around chemicals that are not needed. °· Tell your doctor if you think you have an infection. °· Tell your doctor if you had a preterm labor before. °  °This information is not intended to replace advice given to you by your health care provider. Make sure you  discuss any questions you have with your health care provider. °  °Document Released: 02/03/2009 Document Revised: 03/24/2015 Document Reviewed: 12/10/2012 °Elsevier Interactive Patient Education ©2016 [employer]. ° °

## 2015-09-04 ENCOUNTER — Telehealth: Payer: Self-pay | Admitting: *Deleted

## 2015-09-04 LAB — GLUCOSE TOLERANCE, 3 HOURS
GLUCOSE, 2 HOUR-GESTATIONAL: 87 mg/dL (ref 70–164)
Glucose Tolerance, 1 hour: 136 mg/dL (ref 70–189)
Glucose Tolerance, Fasting: 67 mg/dL (ref 65–99)
Glucose, GTT - 3 Hour: 49 mg/dL — ABNORMAL LOW (ref 70–144)

## 2015-09-04 NOTE — Telephone Encounter (Signed)
Called pt with [employer] # [phone].  Pt informed of normal 3hr GTT result and to keep appt as scheduled on 10/20.  Pt agreed and voiced understanding.

## 2015-09-10 ENCOUNTER — Ambulatory Visit (INDEPENDENT_AMBULATORY_CARE_PROVIDER_SITE_OTHER): Payer: Medicaid Other | Admitting: *Deleted

## 2015-09-10 VITALS — BP 99/64

## 2015-09-10 DIAGNOSIS — O09213 Supervision of pregnancy with history of pre-term labor, third trimester: Secondary | ICD-10-CM

## 2015-09-10 DIAGNOSIS — Z8751 Personal history of pre-term labor: Secondary | ICD-10-CM

## 2015-09-10 LAB — POCT URINALYSIS DIP (DEVICE)
BILIRUBIN URINE: NEGATIVE
GLUCOSE, UA: NEGATIVE mg/dL
Hgb urine dipstick: NEGATIVE
KETONES UR: NEGATIVE mg/dL
Leukocytes, UA: NEGATIVE
Nitrite: NEGATIVE
Protein, ur: NEGATIVE mg/dL
Specific Gravity, Urine: 1.01 (ref 1.005–1.030)
Urobilinogen, UA: 0.2 mg/dL (ref 0.0–1.0)
pH: 7 (ref 5.0–8.0)

## 2015-09-10 NOTE — Progress Notes (Signed)
No language line interpreter available. Patient was able to tell me name and birthdate and confirm injection site. Stated that baby is moving.

## 2015-09-17 ENCOUNTER — Encounter: Payer: Medicaid Other | Admitting: Family Medicine

## 2015-09-28 ENCOUNTER — Ambulatory Visit (INDEPENDENT_AMBULATORY_CARE_PROVIDER_SITE_OTHER): Payer: Medicaid Other | Admitting: Obstetrics & Gynecology

## 2015-09-28 VITALS — BP 98/60 | HR 84 | Temp 97.9°F | Wt 131.6 lb

## 2015-09-28 DIAGNOSIS — O0993 Supervision of high risk pregnancy, unspecified, third trimester: Secondary | ICD-10-CM

## 2015-09-28 LAB — POCT URINALYSIS DIP (DEVICE)
Bilirubin Urine: NEGATIVE
GLUCOSE, UA: NEGATIVE mg/dL
HGB URINE DIPSTICK: NEGATIVE
KETONES UR: NEGATIVE mg/dL
Leukocytes, UA: NEGATIVE
Nitrite: NEGATIVE
PH: 7 (ref 5.0–8.0)
PROTEIN: NEGATIVE mg/dL
SPECIFIC GRAVITY, URINE: 1.015 (ref 1.005–1.030)
UROBILINOGEN UA: 1 mg/dL (ref 0.0–1.0)

## 2015-09-28 NOTE — Patient Instructions (Signed)
Etonogestrel implant What is this medicine? ETONOGESTREL (et oh noe JES trel) is a contraceptive (birth control) device. It is used to prevent pregnancy. It can be used for up to 3 years. This medicine may be used for other purposes; ask your health care provider or pharmacist if you have questions. What should I tell my health care provider before I take this medicine? They need to know if you have any of these conditions: -abnormal vaginal bleeding -blood vessel disease or blood clots -cancer of the breast, cervix, or liver -depression -diabetes -gallbladder disease -headaches -heart disease or recent heart attack -high blood pressure -high cholesterol -kidney disease -liver disease -renal disease -seizures -tobacco smoker -an unusual or allergic reaction to etonogestrel, other hormones, anesthetics or antiseptics, medicines, foods, dyes, or preservatives -pregnant or trying to get pregnant -breast-feeding How should I use this medicine? This device is inserted just under the skin on the inner side of your upper arm by a health care professional. Talk to your pediatrician regarding the use of this medicine in children. Special care may be needed. Overdosage: If you think you have taken too much of this medicine contact a poison control center or emergency room at once. NOTE: This medicine is only for you. Do not share this medicine with others. What if I miss a dose? This does not apply. What may interact with this medicine? Do not take this medicine with any of the following medications: -amprenavir -bosentan -fosamprenavir This medicine may also interact with the following medications: -barbiturate medicines for inducing sleep or treating seizures -certain medicines for fungal infections like ketoconazole and itraconazole -griseofulvin -medicines to treat seizures like carbamazepine, felbamate, oxcarbazepine, phenytoin,  topiramate -modafinil -phenylbutazone -rifampin -some medicines to treat HIV infection like atazanavir, indinavir, lopinavir, nelfinavir, tipranavir, ritonavir -St. John's wort This list may not describe all possible interactions. Give your health care provider a list of all the medicines, herbs, non-prescription drugs, or dietary supplements you use. Also tell them if you smoke, drink alcohol, or use illegal drugs. Some items may interact with your medicine. What should I watch for while using this medicine? This product does not protect you against HIV infection (AIDS) or other sexually transmitted diseases. You should be able to feel the implant by pressing your fingertips over the skin where it was inserted. Contact your doctor if you cannot feel the implant, and use a non-hormonal birth control method (such as condoms) until your doctor confirms that the implant is in place. If you feel that the implant may have broken or become bent while in your arm, contact your healthcare provider. What side effects may I notice from receiving this medicine? Side effects that you should report to your doctor or health care professional as soon as possible: -allergic reactions like skin rash, itching or hives, swelling of the face, lips, or tongue -breast lumps -changes in emotions or moods -depressed mood -heavy or prolonged menstrual bleeding -pain, irritation, swelling, or bruising at the insertion site -scar at site of insertion -signs of infection at the insertion site such as fever, and skin redness, pain or discharge -signs of pregnancy -signs and symptoms of a blood clot such as breathing problems; changes in vision; chest pain; severe, sudden headache; pain, swelling, warmth in the leg; trouble speaking; sudden numbness or weakness of the face, arm or leg -signs and symptoms of liver injury like dark yellow or brown urine; general ill feeling or flu-like symptoms; light-colored stools; loss of  appetite; nausea; right upper belly   pain; unusually weak or tired; yellowing of the eyes or skin -unusual vaginal bleeding, discharge -signs and symptoms of a stroke like changes in vision; confusion; trouble speaking or understanding; severe headaches; sudden numbness or weakness of the face, arm or leg; trouble walking; dizziness; loss of balance or coordination Side effects that usually do not require medical attention (Report these to your doctor or health care professional if they continue or are bothersome.): -acne -back pain -breast pain -changes in weight -dizziness -general ill feeling or flu-like symptoms -headache -irregular menstrual bleeding -nausea -sore throat -vaginal irritation or inflammation This list may not describe all possible side effects. Call your doctor for medical advice about side effects. You may report side effects to FDA at 1-800-FDA-1088. Where should I keep my medicine? This drug is given in a hospital or clinic and will not be stored at home. NOTE: This sheet is a summary. It may not cover all possible information. If you have questions about this medicine, talk to your doctor, pharmacist, or health care provider.    2016, Elsevier/Gold Standard. (2014-08-22 14:07:06)  

## 2015-09-28 NOTE — Progress Notes (Signed)
Reviewed breastfeeding tip of the week.  

## 2015-09-28 NOTE — Progress Notes (Signed)
Subjective:  [name] is a 19 y.o. G2P0101 at 2858w4d being seen today for ongoing prenatal care.  Patient reports no complaints.  Contractions: Not present.  Vag. Bleeding: None. Movement: Present. Denies leaking of fluid.   The following portions of the patient's history were reviewed and updated as appropriate: allergies, current medications, past family history, past medical history, past social history, past surgical history and problem list. Problem list updated.  Objective:   Filed Vitals:   09/28/15 0908  BP: 98/60  Pulse: 84  Temp: 97.9 F (36.6 C)  Weight: 131 lb 9.6 oz (59.693 kg)    Fetal Status: Fetal Heart Rate (bpm): 152   Movement: Present     General:  Alert, oriented and cooperative. Patient is in no acute distress.  Skin: Skin is warm and dry. No rash noted.   Cardiovascular: Normal heart rate noted  Respiratory: Normal respiratory effort, no problems with respiration noted  Abdomen: Soft, gravid, appropriate for gestational age. Pain/Pressure: Absent     Pelvic: Vag. Bleeding: None     Cervical exam deferred        Extremities: Normal range of motion.  Edema: None  Mental Status: Normal mood and affect. Normal behavior. Normal judgment and thought content.   Urinalysis: Urine Protein: Negative Urine Glucose: Negative  Assessment and Plan:  Pregnancy: G2P0101 at 6258w4d  1. Supervision of high risk pregnancy, antepartum, third trimester -pick pediatrician -finalize birthcontrol (now thinking Nexplanon) -skipping visits; patient advised to keep appts  Preterm labor symptoms and general obstetric precautions including but not limited to vaginal bleeding, contractions, leaking of fluid and fetal movement were reviewed in detail with the patient. Please refer to After Visit Summary for other counseling recommendations.  Return in about 2 weeks (around 10/12/2015).  One week for RN visit--17P   Lesly DukesKelly H Deshundra Waller, MD

## 2015-10-05 ENCOUNTER — Ambulatory Visit (INDEPENDENT_AMBULATORY_CARE_PROVIDER_SITE_OTHER): Payer: Medicaid Other

## 2015-10-05 VITALS — BP 104/64 | HR 94 | Wt 130.4 lb

## 2015-10-05 DIAGNOSIS — Z8751 Personal history of pre-term labor: Secondary | ICD-10-CM

## 2015-10-05 DIAGNOSIS — O09213 Supervision of pregnancy with history of pre-term labor, third trimester: Secondary | ICD-10-CM | POA: Diagnosis present

## 2015-10-12 ENCOUNTER — Ambulatory Visit (INDEPENDENT_AMBULATORY_CARE_PROVIDER_SITE_OTHER): Payer: Medicaid Other | Admitting: Obstetrics and Gynecology

## 2015-10-12 ENCOUNTER — Encounter: Payer: Self-pay | Admitting: Obstetrics and Gynecology

## 2015-10-12 VITALS — BP 103/68 | HR 94 | Temp 98.4°F | Wt 132.5 lb

## 2015-10-12 DIAGNOSIS — O09212 Supervision of pregnancy with history of pre-term labor, second trimester: Secondary | ICD-10-CM

## 2015-10-12 DIAGNOSIS — O0993 Supervision of high risk pregnancy, unspecified, third trimester: Secondary | ICD-10-CM

## 2015-10-12 LAB — POCT URINALYSIS DIP (DEVICE)
Bilirubin Urine: NEGATIVE
GLUCOSE, UA: NEGATIVE mg/dL
Hgb urine dipstick: NEGATIVE
KETONES UR: NEGATIVE mg/dL
LEUKOCYTES UA: NEGATIVE
Nitrite: NEGATIVE
Protein, ur: NEGATIVE mg/dL
SPECIFIC GRAVITY, URINE: 1.015 (ref 1.005–1.030)
Urobilinogen, UA: 1 mg/dL (ref 0.0–1.0)
pH: 7 (ref 5.0–8.0)

## 2015-10-12 NOTE — Progress Notes (Signed)
Language Resources- Shimada Khine Pain- when baby moves Educated pt on Importance of Breastfeeding for 1st six months [facility]

## 2015-10-12 NOTE — Progress Notes (Signed)
Subjective:  [name] is a 19 y.o. G2P0101 at 6017w4d being seen today for ongoing prenatal care.  She is currently monitored for the following issues for this high-risk pregnancy: Patient Active Problem List   Diagnosis Date Noted  . Supervision of high risk pregnancy, antepartum 07/23/2015  . Previous preterm delivery in second trimester, antepartum    Patient reports no complaints.  Contractions: Not present. Vag. Bleeding: None.  Movement: Present. Denies leaking of fluid.   The following portions of the patient's history were reviewed and updated as appropriate: allergies, current medications, past family history, past medical history, past social history, past surgical history and problem list. Problem list updated.  Objective:   Filed Vitals:   10/12/15 0923  BP: 103/68  Pulse: 94  Temp: 98.4 F (36.9 C)  Weight: 132 lb 8 oz (60.102 kg)    Fetal Status: Fetal Heart Rate (bpm): 141   Movement: Present     General:  Alert, oriented and cooperative. Patient is in no acute distress.  Skin: Skin is warm and dry. No rash noted.   Cardiovascular: Normal heart rate noted  Respiratory: Normal respiratory effort, no problems with respiration noted  Abdomen: Soft, gravid, appropriate for gestational age. Pain/Pressure: Present     Pelvic: Vag. Bleeding: None     Cervical exam deferred        Extremities: Normal range of motion.  Edema: None  Mental Status: Normal mood and affect. Normal behavior. Normal judgment and thought content.   Urinalysis: Urine Protein: Negative Urine Glucose: Negative  Assessment and Plan:  Pregnancy: G2P0101 at 5517w4d  1. Supervision of high risk pregnancy, antepartum, third trimester   2. Previous preterm delivery in second trimester, antepartum Continue weekly 17-P  Preterm labor symptoms and general obstetric precautions including but not limited to vaginal bleeding, contractions, leaking of fluid and fetal movement were reviewed in detail with the  patient. Please refer to After Visit Summary for other counseling recommendations.  Return in about 1 week (around 10/19/2015).   Catalina AntiguaPeggy Anselma Herbel, MD

## 2015-10-19 ENCOUNTER — Encounter: Payer: Medicaid Other | Admitting: Family Medicine

## 2015-10-20 ENCOUNTER — Ambulatory Visit (INDEPENDENT_AMBULATORY_CARE_PROVIDER_SITE_OTHER): Payer: Medicaid Other | Admitting: *Deleted

## 2015-10-20 VITALS — BP 96/61 | HR 92 | Wt 133.4 lb

## 2015-10-20 DIAGNOSIS — O09212 Supervision of pregnancy with history of pre-term labor, second trimester: Secondary | ICD-10-CM

## 2015-10-26 ENCOUNTER — Encounter: Payer: Self-pay | Admitting: Family Medicine

## 2015-10-26 ENCOUNTER — Ambulatory Visit (INDEPENDENT_AMBULATORY_CARE_PROVIDER_SITE_OTHER): Payer: Medicaid Other | Admitting: Family Medicine

## 2015-10-26 VITALS — BP 105/76 | HR 91 | Temp 97.8°F | Wt 133.8 lb

## 2015-10-26 DIAGNOSIS — O09212 Supervision of pregnancy with history of pre-term labor, second trimester: Secondary | ICD-10-CM

## 2015-10-26 DIAGNOSIS — O09213 Supervision of pregnancy with history of pre-term labor, third trimester: Secondary | ICD-10-CM | POA: Diagnosis not present

## 2015-10-26 DIAGNOSIS — O0993 Supervision of high risk pregnancy, unspecified, third trimester: Secondary | ICD-10-CM

## 2015-10-26 LAB — POCT URINALYSIS DIP (DEVICE)
BILIRUBIN URINE: NEGATIVE
GLUCOSE, UA: NEGATIVE mg/dL
Hgb urine dipstick: NEGATIVE
KETONES UR: NEGATIVE mg/dL
Leukocytes, UA: NEGATIVE
NITRITE: NEGATIVE
PH: 7 (ref 5.0–8.0)
PROTEIN: NEGATIVE mg/dL
Specific Gravity, Urine: 1.01 (ref 1.005–1.030)
Urobilinogen, UA: 0.2 mg/dL (ref 0.0–1.0)

## 2015-10-26 NOTE — Patient Instructions (Signed)
Third Trimester of Pregnancy The third trimester is from week 29 through week 42, months 7 through 9. The third trimester is a time when the fetus is growing rapidly. At the end of the ninth month, the fetus is about 20 inches in length and weighs 6-10 pounds.  BODY CHANGES Your body goes through many changes during pregnancy. The changes vary from woman to woman.   Your weight will continue to increase. You can expect to gain 25-35 pounds (11-16 kg) by the end of the pregnancy.  You may begin to get stretch marks on your hips, abdomen, and breasts.  You may urinate more often because the fetus is moving lower into your pelvis and pressing on your bladder.  You may develop or continue to have heartburn as a result of your pregnancy.  You may develop constipation because certain hormones are causing the muscles that push waste through your intestines to slow down.  You may develop hemorrhoids or swollen, bulging veins (varicose veins).  You may have pelvic pain because of the weight gain and pregnancy hormones relaxing your joints between the bones in your pelvis. Backaches may result from overexertion of the muscles supporting your posture.  You may have changes in your hair. These can include thickening of your hair, rapid growth, and changes in texture. Some women also have hair loss during or after pregnancy, or hair that feels dry or thin. Your hair will most likely return to normal after your baby is born.  Your breasts will continue to grow and be tender. A yellow discharge may leak from your breasts called colostrum.  Your belly button may stick out.  You may feel short of breath because of your expanding uterus.  You may notice the fetus "dropping," or moving lower in your abdomen.  You may have a bloody mucus discharge. This usually occurs a few days to a week before labor begins.  Your cervix becomes thin and soft (effaced) near your due date. WHAT TO EXPECT AT YOUR  PRENATAL EXAMS  You will have prenatal exams every 2 weeks until week 36. Then, you will have weekly prenatal exams. During a routine prenatal visit:  You will be weighed to make sure you and the fetus are growing normally.  Your blood pressure is taken.  Your abdomen will be measured to track your baby's growth.  The fetal heartbeat will be listened to.  Any test results from the previous visit will be discussed.  You may have a cervical check near your due date to see if you have effaced. At around 36 weeks, your caregiver will check your cervix. At the same time, your caregiver will also perform a test on the secretions of the vaginal tissue. This test is to determine if a type of bacteria, Group B streptococcus, is present. Your caregiver will explain this further. Your caregiver may ask you:  What your birth plan is.  How you are feeling.  If you are feeling the baby move.  If you have had any abnormal symptoms, such as leaking fluid, bleeding, severe headaches, or abdominal cramping.  If you are using any tobacco products, including cigarettes, chewing tobacco, and electronic cigarettes.  If you have any questions. Other tests or screenings that may be performed during your third trimester include:  Blood tests that check for low iron levels (anemia).  Fetal testing to check the health, activity level, and growth of the fetus. Testing is done if you have certain medical conditions or if   there are problems during the pregnancy.  HIV (human immunodeficiency virus) testing. If you are at high risk, you may be screened for HIV during your third trimester of pregnancy. FALSE LABOR You may feel small, irregular contractions that eventually go away. These are called Braxton Hicks contractions, or false labor. Contractions may last for hours, days, or even weeks before true labor sets in. If contractions come at regular intervals, intensify, or become painful, it is best to be seen  by your caregiver.  SIGNS OF LABOR   Menstrual-like cramps.  Contractions that are 5 minutes apart or less.  Contractions that start on the top of the uterus and spread down to the lower abdomen and back.  A sense of increased pelvic pressure or back pain.  A watery or bloody mucus discharge that comes from the vagina. If you have any of these signs before the 37th week of pregnancy, call your caregiver right away. You need to go to the hospital to get checked immediately. HOME CARE INSTRUCTIONS   Avoid all smoking, herbs, alcohol, and unprescribed drugs. These chemicals affect the formation and growth of the baby.  Do not use any tobacco products, including cigarettes, chewing tobacco, and electronic cigarettes. If you need help quitting, ask your health care provider. You may receive counseling support and other resources to help you quit.  Follow your caregiver's instructions regarding medicine use. There are medicines that are either safe or unsafe to take during pregnancy.  Exercise only as directed by your caregiver. Experiencing uterine cramps is a good sign to stop exercising.  Continue to eat regular, healthy meals.  Wear a good support bra for breast tenderness.  Do not use hot tubs, steam rooms, or saunas.  Wear your seat belt at all times when driving.  Avoid raw meat, uncooked cheese, cat litter boxes, and soil used by cats. These carry germs that can cause birth defects in the baby.  Take your prenatal vitamins.  Take 1500-2000 mg of calcium daily starting at the 20th week of pregnancy until you deliver your baby.  Try taking a stool softener (if your caregiver approves) if you develop constipation. Eat more high-fiber foods, such as fresh vegetables or fruit and whole grains. Drink plenty of fluids to keep your urine clear or pale yellow.  Take warm sitz baths to soothe any pain or discomfort caused by hemorrhoids. Use hemorrhoid cream if your caregiver  approves.  If you develop varicose veins, wear support hose. Elevate your feet for 15 minutes, 3-4 times a day. Limit salt in your diet.  Avoid heavy lifting, wear low heal shoes, and practice good posture.  Rest a lot with your legs elevated if you have leg cramps or low back pain.  Visit your dentist if you have not gone during your pregnancy. Use a soft toothbrush to brush your teeth and be gentle when you floss.  A sexual relationship may be continued unless your caregiver directs you otherwise.  Do not travel far distances unless it is absolutely necessary and only with the approval of your caregiver.  Take prenatal classes to understand, practice, and ask questions about the labor and delivery.  Make a trial run to the hospital.  Pack your hospital bag.  Prepare the baby's nursery.  Continue to go to all your prenatal visits as directed by your caregiver. SEEK MEDICAL CARE IF:  You are unsure if you are in labor or if your water has broken.  You have dizziness.  You have   mild pelvic cramps, pelvic pressure, or nagging pain in your abdominal area.  You have persistent nausea, vomiting, or diarrhea.  You have a bad smelling vaginal discharge.  You have pain with urination. SEEK IMMEDIATE MEDICAL CARE IF:   You have a fever.  You are leaking fluid from your vagina.  You have spotting or bleeding from your vagina.  You have severe abdominal cramping or pain.  You have rapid weight loss or gain.  You have shortness of breath with chest pain.  You notice sudden or extreme swelling of your face, hands, ankles, feet, or legs.  You have not felt your baby move in over an hour.  You have severe headaches that do not go away with medicine.  You have vision changes.   This information is not intended to replace advice given to you by your health care provider. Make sure you discuss any questions you have with your health care provider.   Document Released:  11/01/2001 Document Revised: 11/28/2014 Document Reviewed: 01/08/2013 Elsevier Interactive Patient Education 2016 [employer]  Breastfeeding Deciding to breastfeed is one of the best choices you can make for you and your baby. A change in hormones during pregnancy causes your breast tissue to grow and increases the number and size of your milk ducts. These hormones also allow proteins, sugars, and fats from your blood supply to make breast milk in your milk-producing glands. Hormones prevent breast milk from being released before your baby is born as well as prompt milk flow after birth. Once breastfeeding has begun, thoughts of your baby, as well as his or her sucking or crying, can stimulate the release of milk from your milk-producing glands.  BENEFITS OF BREASTFEEDING For Your Baby  Your first milk (colostrum) helps your baby's digestive system function better.  There are antibodies in your milk that help your baby fight off infections.  Your baby has a lower incidence of asthma, allergies, and sudden infant death syndrome.  The nutrients in breast milk are better for your baby than infant formulas and are designed uniquely for your baby's needs.  Breast milk improves your baby's brain development.  Your baby is less likely to develop other conditions, such as childhood obesity, asthma, or type 2 diabetes mellitus. For You  Breastfeeding helps to create a very special bond between you and your baby.  Breastfeeding is convenient. Breast milk is always available at the correct temperature and costs nothing.  Breastfeeding helps to burn calories and helps you lose the weight gained during pregnancy.  Breastfeeding makes your uterus contract to its prepregnancy size faster and slows bleeding (lochia) after you give birth.   Breastfeeding helps to lower your risk of developing type 2 diabetes mellitus, osteoporosis, and breast or ovarian cancer later in life. SIGNS THAT YOUR BABY IS  HUNGRY Early Signs of Hunger  Increased alertness or activity.  Stretching.  Movement of the head from side to side.  Movement of the head and opening of the mouth when the corner of the mouth or cheek is stroked (rooting).  Increased sucking sounds, smacking lips, cooing, sighing, or squeaking.  Hand-to-mouth movements.  Increased sucking of fingers or hands. Late Signs of Hunger  Fussing.  Intermittent crying. Extreme Signs of Hunger Signs of extreme hunger will require calming and consoling before your baby will be able to breastfeed successfully. Do not wait for the following signs of extreme hunger to occur before you initiate breastfeeding:  Restlessness.  A loud, strong cry.  Screaming.   BREASTFEEDING BASICS Breastfeeding Initiation  Find a comfortable place to sit or lie down, with your neck and back well supported.  Place a pillow or rolled up blanket under your baby to bring him or her to the level of your breast (if you are seated). Nursing pillows are specially designed to help support your arms and your baby while you breastfeed.  Make sure that your baby's abdomen is facing your abdomen.  Gently massage your breast. With your fingertips, massage from your chest wall toward your nipple in a circular motion. This encourages milk flow. You may need to continue this action during the feeding if your milk flows slowly.  Support your breast with 4 fingers underneath and your thumb above your nipple. Make sure your fingers are well away from your nipple and your baby's mouth.  Stroke your baby's lips gently with your finger or nipple.  When your baby's mouth is open wide enough, quickly bring your baby to your breast, placing your entire nipple and as much of the colored area around your nipple (areola) as possible into your baby's mouth.  More areola should be visible above your baby's upper lip than below the lower lip.  Your baby's tongue should be between his  or her lower gum and your breast.  Ensure that your baby's mouth is correctly positioned around your nipple (latched). Your baby's lips should create a seal on your breast and be turned out (everted).  It is common for your baby to suck about 2-3 minutes in order to start the flow of breast milk. Latching Teaching your baby how to latch on to your breast properly is very important. An improper latch can cause nipple pain and decreased milk supply for you and poor weight gain in your baby. Also, if your baby is not latched onto your nipple properly, he or she may swallow some air during feeding. This can make your baby fussy. Burping your baby when you switch breasts during the feeding can help to get rid of the air. However, teaching your baby to latch on properly is still the best way to prevent fussiness from swallowing air while breastfeeding. Signs that your baby has successfully latched on to your nipple:  Silent tugging or silent sucking, without causing you pain.  Swallowing heard between every 3-4 sucks.  Muscle movement above and in front of his or her ears while sucking. Signs that your baby has not successfully latched on to nipple:  Sucking sounds or smacking sounds from your baby while breastfeeding.  Nipple pain. If you think your baby has not latched on correctly, slip your finger into the corner of your baby's mouth to break the suction and place it between your baby's gums. Attempt breastfeeding initiation again. Signs of Successful Breastfeeding Signs from your baby:  A gradual decrease in the number of sucks or complete cessation of sucking.  Falling asleep.  Relaxation of his or her body.  Retention of a small amount of milk in his or her mouth.  Letting go of your breast by himself or herself. Signs from you:  Breasts that have increased in firmness, weight, and size 1-3 hours after feeding.  Breasts that are softer immediately after  breastfeeding.  Increased milk volume, as well as a change in milk consistency and color by the fifth day of breastfeeding.  Nipples that are not sore, cracked, or bleeding. Signs That Your Baby is Getting Enough Milk  Wetting at least 3 diapers in a 24-hour period.   The urine should be clear and pale yellow by age 5 days.  At least 3 stools in a 24-hour period by age 5 days. The stool should be soft and yellow.  At least 3 stools in a 24-hour period by age 7 days. The stool should be seedy and yellow.  No loss of weight greater than 10% of birth weight during the first 3 days of age.  Average weight gain of 4-7 ounces (113-198 g) per week after age 4 days.  Consistent daily weight gain by age 5 days, without weight loss after the age of 2 weeks. After a feeding, your baby may spit up a small amount. This is common. BREASTFEEDING FREQUENCY AND DURATION Frequent feeding will help you make more milk and can prevent sore nipples and breast engorgement. Breastfeed when you feel the need to reduce the fullness of your breasts or when your baby shows signs of hunger. This is called "breastfeeding on demand." Avoid introducing a pacifier to your baby while you are working to establish breastfeeding (the first 4-6 weeks after your baby is born). After this time you may choose to use a pacifier. Research has shown that pacifier use during the first year of a baby's life decreases the risk of sudden infant death syndrome (SIDS). Allow your baby to feed on each breast as long as he or she wants. Breastfeed until your baby is finished feeding. When your baby unlatches or falls asleep while feeding from the first breast, offer the second breast. Because newborns are often sleepy in the first few weeks of life, you may need to awaken your baby to get him or her to feed. Breastfeeding times will vary from baby to baby. However, the following rules can serve as a guide to help you ensure that your baby is  properly fed:  Newborns (babies 4 weeks of age or younger) may breastfeed every 1-3 hours.  Newborns should not go longer than 3 hours during the day or 5 hours during the night without breastfeeding.  You should breastfeed your baby a minimum of 8 times in a 24-hour period until you begin to introduce solid foods to your baby at around 6 months of age. BREAST MILK PUMPING Pumping and storing breast milk allows you to ensure that your baby is exclusively fed your breast milk, even at times when you are unable to breastfeed. This is especially important if you are going back to work while you are still breastfeeding or when you are not able to be present during feedings. Your lactation consultant can give you guidelines on how long it is safe to store breast milk. A breast pump is a machine that allows you to pump milk from your breast into a sterile bottle. The pumped breast milk can then be stored in a refrigerator or freezer. Some breast pumps are operated by hand, while others use electricity. Ask your lactation consultant which type will work best for you. Breast pumps can be purchased, but some hospitals and breastfeeding support groups lease breast pumps on a monthly basis. A lactation consultant can teach you how to hand express breast milk, if you prefer not to use a pump. CARING FOR YOUR BREASTS WHILE YOU BREASTFEED Nipples can become dry, cracked, and sore while breastfeeding. The following recommendations can help keep your breasts moisturized and healthy:  Avoid using soap on your nipples.  Wear a supportive bra. Although not required, special nursing bras and tank tops are designed to allow access to your   breasts for breastfeeding without taking off your entire bra or top. Avoid wearing underwire-style bras or extremely tight bras.  Air dry your nipples for 3-4minutes after each feeding.  Use only cotton bra pads to absorb leaked breast milk. Leaking of breast milk between feedings  is normal.  Use lanolin on your nipples after breastfeeding. Lanolin helps to maintain your skin's normal moisture barrier. If you use pure lanolin, you do not need to wash it off before feeding your baby again. Pure lanolin is not toxic to your baby. You may also hand express a few drops of breast milk and gently massage that milk into your nipples and allow the milk to air dry. In the first few weeks after giving birth, some women experience extremely full breasts (engorgement). Engorgement can make your breasts feel heavy, warm, and tender to the touch. Engorgement peaks within 3-5 days after you give birth. The following recommendations can help ease engorgement:  Completely empty your breasts while breastfeeding or pumping. You may want to start by applying warm, moist heat (in the shower or with warm water-soaked hand towels) just before feeding or pumping. This increases circulation and helps the milk flow. If your baby does not completely empty your breasts while breastfeeding, pump any extra milk after he or she is finished.  Wear a snug bra (nursing or regular) or tank top for 1-2 days to signal your body to slightly decrease milk production.  Apply ice packs to your breasts, unless this is too uncomfortable for you.  Make sure that your baby is latched on and positioned properly while breastfeeding. If engorgement persists after 48 hours of following these recommendations, contact your health care provider or a lactation consultant. OVERALL HEALTH CARE RECOMMENDATIONS WHILE BREASTFEEDING  Eat healthy foods. Alternate between meals and snacks, eating 3 of each per day. Because what you eat affects your breast milk, some of the foods may make your baby more irritable than usual. Avoid eating these foods if you are sure that they are negatively affecting your baby.  Drink milk, fruit juice, and water to satisfy your thirst (about 10 glasses a day).  Rest often, relax, and continue to take  your prenatal vitamins to prevent fatigue, stress, and anemia.  Continue breast self-awareness checks.  Avoid chewing and smoking tobacco. Chemicals from cigarettes that pass into breast milk and exposure to secondhand smoke may harm your baby.  Avoid alcohol and drug use, including marijuana. Some medicines that may be harmful to your baby can pass through breast milk. It is important to ask your health care provider before taking any medicine, including all over-the-counter and prescription medicine as well as vitamin and herbal supplements. It is possible to become pregnant while breastfeeding. If birth control is desired, ask your health care provider about options that will be safe for your baby. SEEK MEDICAL CARE IF:  You feel like you want to stop breastfeeding or have become frustrated with breastfeeding.  You have painful breasts or nipples.  Your nipples are cracked or bleeding.  Your breasts are red, tender, or warm.  You have a swollen area on either breast.  You have a fever or chills.  You have nausea or vomiting.  You have drainage other than breast milk from your nipples.  Your breasts do not become full before feedings by the fifth day after you give birth.  You feel sad and depressed.  Your baby is too sleepy to eat well.  Your baby is having trouble sleeping.     Your baby is wetting less than 3 diapers in a 24-hour period.  Your baby has less than 3 stools in a 24-hour period.  Your baby's skin or the white part of his or her eyes becomes yellow.   Your baby is not gaining weight by 5 days of age. SEEK IMMEDIATE MEDICAL CARE IF:  Your baby is overly tired (lethargic) and does not want to wake up and feed.  Your baby develops an unexplained fever.   This information is not intended to replace advice given to you by your health care provider. Make sure you discuss any questions you have with your health care provider.   Document Released: 11/07/2005  Document Revised: 07/29/2015 Document Reviewed: 05/01/2013 Elsevier Interactive Patient Education 2016 [employer]  

## 2015-10-26 NOTE — Progress Notes (Signed)
Subjective:  Terri MulliganYi Beck is a 19 y.o. G2P0101 at 95109w4d being seen today for ongoing prenatal care.  She is currently monitored for the following issues for this high-risk pregnancy and has Previous preterm delivery in second trimester, antepartum and Supervision of high risk pregnancy, antepartum on her problem list.  Patient reports backache and contractions since 1 wk.  Contractions: Not present. Vag. Bleeding: None.  Movement: Present. Denies leaking of fluid.   The following portions of the patient's history were reviewed and updated as appropriate: allergies, current medications, past family history, past medical history, past social history, past surgical history and problem list. Problem list updated.  Objective:   Filed Vitals:   10/26/15 0920  BP: 105/76  Pulse: 91  Temp: 97.8 F (36.6 C)  Weight: 133 lb 12.8 oz (60.691 kg)    Fetal Status: Fetal Heart Rate (bpm): 151 Fundal Height: 34 cm Movement: Present  Presentation: Vertex  General:  Alert, oriented and cooperative. Patient is in no acute distress.  Skin: Skin is warm and dry. No rash noted.   Cardiovascular: Normal heart rate noted  Respiratory: Normal respiratory effort, no problems with respiration noted  Abdomen: Soft, gravid, appropriate for gestational age. Pain/Pressure: Present     Pelvic: Vag. Bleeding: None     Cervical exam deferred        Extremities: Normal range of motion.  Edema: None  Mental Status: Normal mood and affect. Normal behavior. Normal judgment and thought content.   Urinalysis: Urine Protein: Negative Urine Glucose: Negative  Assessment and Plan:  Pregnancy: G2P0101 at 3109w4d  1. Supervision of high risk pregnancy, antepartum, third trimester Continue prenatal care - cultures next week  2. Previous preterm delivery in second trimester, antepartum Continue 17 OHP  Preterm labor symptoms and general obstetric precautions including but not limited to vaginal bleeding, contractions, leaking of  fluid and fetal movement were reviewed in detail with the patient. Please refer to After Visit Summary for other counseling recommendations.  Return in 1 week (on 11/02/2015).   Reva Boresanya S Becca Bayne, MD

## 2015-11-02 ENCOUNTER — Ambulatory Visit (INDEPENDENT_AMBULATORY_CARE_PROVIDER_SITE_OTHER): Payer: Medicaid Other | Admitting: Family Medicine

## 2015-11-02 ENCOUNTER — Other Ambulatory Visit (HOSPITAL_COMMUNITY)
Admission: RE | Admit: 2015-11-02 | Discharge: 2015-11-02 | Disposition: A | Discharge status: Home / Self Care | Payer: Medicaid Other | Source: Ambulatory Visit | Attending: Family Medicine | Admitting: Family Medicine

## 2015-11-02 VITALS — BP 114/69 | HR 95 | Temp 97.6°F | Wt 134.5 lb

## 2015-11-02 DIAGNOSIS — O09212 Supervision of pregnancy with history of pre-term labor, second trimester: Secondary | ICD-10-CM | POA: Diagnosis not present

## 2015-11-02 DIAGNOSIS — O0993 Supervision of high risk pregnancy, unspecified, third trimester: Secondary | ICD-10-CM

## 2015-11-02 DIAGNOSIS — Z113 Encounter for screening for infections with a predominantly sexual mode of transmission: Secondary | ICD-10-CM | POA: Insufficient documentation

## 2015-11-02 LAB — POCT URINALYSIS DIP (DEVICE)
GLUCOSE, UA: NEGATIVE mg/dL
Hgb urine dipstick: NEGATIVE
KETONES UR: NEGATIVE mg/dL
LEUKOCYTES UA: NEGATIVE
Nitrite: NEGATIVE
Protein, ur: NEGATIVE mg/dL
SPECIFIC GRAVITY, URINE: 1.01 (ref 1.005–1.030)
Urobilinogen, UA: 0.2 mg/dL (ref 0.0–1.0)
pH: 6.5 (ref 5.0–8.0)

## 2015-11-02 LAB — OB RESULTS CONSOLE GBS: STREP GROUP B AG: NEGATIVE

## 2015-11-02 MED ORDER — HYDROXYPROGESTERONE CAPROATE 250 MG/ML IM OIL
250.0000 mg | TOPICAL_OIL | Freq: Once | INTRAMUSCULAR | Status: AC
  Administered 2015-11-02: 250 mg via INTRAMUSCULAR

## 2015-11-02 NOTE — Patient Instructions (Signed)

## 2015-11-02 NOTE — Progress Notes (Signed)
Breastfeeding tip of the week reviewed Cultures today Interpreter present

## 2015-11-02 NOTE — Progress Notes (Signed)
Subjective:  [name] is a 19 y.o. G2P0101 at 7376w4d being seen today for ongoing prenatal care.  She is currently monitored for the following issues for this high-risk pregnancy and has Previous preterm delivery in second trimester, antepartum and Supervision of high risk pregnancy, antepartum on her problem list.  Patient reports contractions since several weeks. Reports back pain starting last week. Every episodes of contractions every 2 hours.   Contractions: Irritability. Vag. Bleeding: None.  Movement: Present. Denies leaking of fluid.   The following portions of the patient's history were reviewed and updated as appropriate: allergies, current medications, past family history, past medical history, past social history, past surgical history and problem list. Problem list updated.  Objective:   Filed Vitals:   11/02/15 1004  BP: 114/69  Pulse: 95  Temp: 97.6 F (36.4 C)  Weight: 134 lb 8 oz (61.009 kg)    Fetal Status: Fetal Heart Rate (bpm): 162   Movement: Present     General:  Alert, oriented and cooperative. Patient is in no acute distress.  Skin: Skin is warm and dry. No rash noted.   Cardiovascular: Normal heart rate noted  Respiratory: Normal respiratory effort, no problems with respiration noted  Abdomen: Soft, gravid, appropriate for gestational age. Pain/Pressure: Present     Pelvic: Vag. Bleeding: None Vag D/C Character: White   Cervical exam performed        Extremities: Normal range of motion.  Edema: None  Mental Status: Normal mood and affect. Normal behavior. Normal judgment and thought content.   Urinalysis:      Assessment and Plan:  Pregnancy: G2P0101 at 7776w4d  1. Supervision of high risk pregnancy, antepartum, third trimester -updated [facility] box -GBS and GC/CT collected today  2. Preterm delivery, 17 P today  Preterm labor symptoms and general obstetric precautions including but not limited to vaginal bleeding, contractions, leaking of fluid and fetal  movement were reviewed in detail with the patient. Please refer to After Visit Summary for other counseling recommendations.  Return in about 1 week (around 11/09/2015) for Routine prenatal care.   Federico FlakeKimberly Niles Mariel Lukins, MD

## 2015-11-02 NOTE — Addendum Note (Signed)
Addended by: Garret ReddishBARNES, Melvern Ramone M on: 11/02/2015 10:44 AM   Modules accepted: Orders

## 2015-11-03 LAB — GC/CHLAMYDIA PROBE AMP (~~LOC~~) NOT AT ARMC
Chlamydia: NEGATIVE
NEISSERIA GONORRHEA: NEGATIVE

## 2015-11-04 LAB — CULTURE, BETA STREP (GROUP B ONLY)

## 2015-11-09 ENCOUNTER — Ambulatory Visit (INDEPENDENT_AMBULATORY_CARE_PROVIDER_SITE_OTHER): Payer: Medicaid Other | Admitting: Obstetrics & Gynecology

## 2015-11-09 ENCOUNTER — Encounter: Payer: Self-pay | Admitting: Obstetrics & Gynecology

## 2015-11-09 VITALS — BP 107/69 | HR 90 | Wt 135.0 lb

## 2015-11-09 DIAGNOSIS — O09213 Supervision of pregnancy with history of pre-term labor, third trimester: Secondary | ICD-10-CM

## 2015-11-09 DIAGNOSIS — O0993 Supervision of high risk pregnancy, unspecified, third trimester: Secondary | ICD-10-CM

## 2015-11-09 LAB — POCT URINALYSIS DIP (DEVICE)
BILIRUBIN URINE: NEGATIVE
Glucose, UA: NEGATIVE mg/dL
HGB URINE DIPSTICK: NEGATIVE
KETONES UR: NEGATIVE mg/dL
Leukocytes, UA: NEGATIVE
Nitrite: NEGATIVE
PH: 7 (ref 5.0–8.0)
Protein, ur: NEGATIVE mg/dL
Specific Gravity, Urine: 1.01 (ref 1.005–1.030)
Urobilinogen, UA: 0.2 mg/dL (ref 0.0–1.0)

## 2015-11-09 NOTE — Progress Notes (Signed)
Leyshamu used for interpreter  Reviewed tip of week with patient

## 2015-11-09 NOTE — Progress Notes (Signed)
Subjective:  [name] is a 19 y.o. G2P0101 at 7727w4d being seen today for ongoing prenatal care.  She is currently monitored for the following issues for this high-risk pregnancy and has Previous preterm delivery in second trimester, antepartum and Supervision of high risk pregnancy, antepartum on her problem list.  Patient reports no complaints.  Contractions: Irritability. Vag. Bleeding: None.  Movement: Present. Denies leaking of fluid.   The following portions of the patient's history were reviewed and updated as appropriate: allergies, current medications, past family history, past medical history, past social history, past surgical history and problem list. Problem list updated.  Objective:   Filed Vitals:   11/09/15 0921  BP: 107/69  Pulse: 90  Weight: 135 lb (61.236 kg)    Fetal Status: Fetal Heart Rate (bpm): 140 Fundal Height: 36 cm Movement: Present  Presentation: Vertex  General:  Alert, oriented and cooperative. Patient is in no acute distress.  Skin: Skin is warm and dry. No rash noted.   Cardiovascular: Normal heart rate noted  Respiratory: Normal respiratory effort, no problems with respiration noted  Abdomen: Soft, gravid, appropriate for gestational age. Pain/Pressure: Present     Pelvic: Vag. Bleeding: None Vag D/C Character: White   Cervical exam deferred        Extremities: Normal range of motion.  Edema: None  Mental Status: Normal mood and affect. Normal behavior. Normal judgment and thought content.   Urinalysis: Urine Protein: Negative Urine Glucose: Negative  Assessment and Plan:  Pregnancy: G2P0101 at 627w4d  There are no diagnoses linked to this encounter. Term labor symptoms and general obstetric precautions including but not limited to vaginal bleeding, contractions, leaking of fluid and fetal movement were reviewed in detail with the patient.  Cervical check next week. Nexplanon encouraged.  Please refer to After Visit Summary for other counseling  recommendations.  Return in about 1 week (around 11/16/2015).   Lesly DukesKelly H Kobie Whidby, MD

## 2015-11-12 ENCOUNTER — Inpatient Hospital Stay (HOSPITAL_COMMUNITY): Payer: Medicaid Other | Admitting: Anesthesiology

## 2015-11-12 ENCOUNTER — Inpatient Hospital Stay (HOSPITAL_COMMUNITY)
Admission: AD | Admit: 2015-11-12 | Discharge: 2015-11-14 | DRG: 775 | Disposition: A | Discharge status: Home / Self Care | Payer: Medicaid Other | Source: Ambulatory Visit | Attending: Family Medicine | Admitting: Family Medicine

## 2015-11-12 ENCOUNTER — Encounter (HOSPITAL_COMMUNITY): Payer: Self-pay | Admitting: *Deleted

## 2015-11-12 DIAGNOSIS — Z3A38 38 weeks gestation of pregnancy: Secondary | ICD-10-CM

## 2015-11-12 DIAGNOSIS — O0990 Supervision of high risk pregnancy, unspecified, unspecified trimester: Secondary | ICD-10-CM

## 2015-11-12 DIAGNOSIS — O4292 Full-term premature rupture of membranes, unspecified as to length of time between rupture and onset of labor: Secondary | ICD-10-CM | POA: Diagnosis not present

## 2015-11-12 DIAGNOSIS — O09213 Supervision of pregnancy with history of pre-term labor, third trimester: Secondary | ICD-10-CM

## 2015-11-12 DIAGNOSIS — IMO0001 Reserved for inherently not codable concepts without codable children: Secondary | ICD-10-CM

## 2015-11-12 HISTORY — DX: Other specified health status: Z78.9

## 2015-11-12 LAB — TYPE AND SCREEN
ABO/RH(D): AB POS
ANTIBODY SCREEN: NEGATIVE

## 2015-11-12 LAB — CBC
HCT: 36.4 % (ref 36.0–46.0)
Hemoglobin: 12.2 g/dL (ref 12.0–15.0)
MCH: 26.5 pg (ref 26.0–34.0)
MCHC: 33.5 g/dL (ref 30.0–36.0)
MCV: 79.1 fL (ref 78.0–100.0)
PLATELETS: 267 10*3/uL (ref 150–400)
RBC: 4.6 MIL/uL (ref 3.87–5.11)
RDW: 14.2 % (ref 11.5–15.5)
WBC: 12.6 10*3/uL — AB (ref 4.0–10.5)

## 2015-11-12 MED ORDER — LACTATED RINGERS IV SOLN: 500.0000 mL | INTRAVENOUS | Status: DC | PRN

## 2015-11-12 MED ORDER — FENTANYL 2.5 MCG/ML BUPIVACAINE 1/10 % EPIDURAL INFUSION (WH - ANES)
INTRAMUSCULAR | Status: AC
  Administered 2015-11-12: 14 mL/h
  Filled 2015-11-12: qty 125

## 2015-11-12 MED ORDER — LIDOCAINE HCL (PF) 1 % IJ SOLN
30.0000 mL | INTRAMUSCULAR | Status: DC | PRN
  Filled 2015-11-12: qty 30

## 2015-11-12 MED ORDER — OXYTOCIN BOLUS FROM INFUSION: 500.0000 mL | INTRAVENOUS | Status: DC

## 2015-11-12 MED ORDER — FLEET ENEMA 7-19 GM/118ML RE ENEM: 1.0000 | ENEMA | RECTAL | Status: DC | PRN

## 2015-11-12 MED ORDER — ACETAMINOPHEN 325 MG PO TABS: 650.0000 mg | ORAL_TABLET | ORAL | Status: DC | PRN

## 2015-11-12 MED ORDER — OXYTOCIN 40 UNITS IN LACTATED RINGERS INFUSION - SIMPLE MED
62.5000 mL/h | INTRAVENOUS | Status: DC
  Administered 2015-11-12: 62.5 mL/h via INTRAVENOUS
  Filled 2015-11-12: qty 1000

## 2015-11-12 MED ORDER — PHENYLEPHRINE 40 MCG/ML (10ML) SYRINGE FOR IV PUSH (FOR BLOOD PRESSURE SUPPORT)
PREFILLED_SYRINGE | INTRAVENOUS | Status: AC
  Filled 2015-11-12: qty 20

## 2015-11-12 MED ORDER — LIDOCAINE HCL (PF) 1 % IJ SOLN
INTRAMUSCULAR | Status: DC | PRN
  Administered 2015-11-12 (×2): 4 mL via EPIDURAL

## 2015-11-12 MED ORDER — FENTANYL 2.5 MCG/ML BUPIVACAINE 1/10 % EPIDURAL INFUSION (WH - ANES)
INTRAMUSCULAR | Status: AC
  Administered 2015-11-12: 14 mL/h via EPIDURAL
  Filled 2015-11-12: qty 125

## 2015-11-12 MED ORDER — CITRIC ACID-SODIUM CITRATE 334-500 MG/5ML PO SOLN: 30.0000 mL | ORAL | Status: DC | PRN

## 2015-11-12 MED ORDER — FENTANYL CITRATE (PF) 100 MCG/2ML IJ SOLN
50.0000 ug | INTRAMUSCULAR | Status: DC | PRN
  Administered 2015-11-12: 50 ug via INTRAVENOUS
  Filled 2015-11-12: qty 2

## 2015-11-12 MED ORDER — ONDANSETRON HCL 4 MG/2ML IJ SOLN: 4.0000 mg | Freq: Four times a day (QID) | INTRAMUSCULAR | Status: DC | PRN

## 2015-11-12 MED ORDER — LACTATED RINGERS IV SOLN
INTRAVENOUS | Status: DC
  Administered 2015-11-12: 20:00:00 via INTRAVENOUS

## 2015-11-12 MED ORDER — OXYCODONE-ACETAMINOPHEN 5-325 MG PO TABS: 2.0000 | ORAL_TABLET | ORAL | Status: DC | PRN

## 2015-11-12 MED ORDER — OXYCODONE-ACETAMINOPHEN 5-325 MG PO TABS: 1.0000 | ORAL_TABLET | ORAL | Status: DC | PRN

## 2015-11-12 NOTE — Anesthesia Procedure Notes (Signed)
Epidural Patient location during procedure: OB Start time: 11/12/2015 8:23 PM End time: 11/12/2015 8:30 PM  Staffing Anesthesiologist: Shona SimpsonHOLLIS, Terri Catalano D Performed by: anesthesiologist   Preanesthetic Checklist Completed: patient identified, site marked, surgical consent, pre-op evaluation, timeout performed, IV checked, risks and benefits discussed and monitors and equipment checked  Epidural Patient position: sitting Prep: ChloraPrep Patient monitoring: heart rate, continuous pulse ox and blood pressure Approach: midline Location: L3-L4 Injection technique: LOR saline  Needle:  Needle type: Tuohy  Needle gauge: 17 G Needle length: 9 cm Catheter type: closed end flexible Catheter size: 20 Guage Test dose: negative and 1.5% lidocaine  Assessment Events: blood not aspirated, injection not painful, no injection resistance and no paresthesia  Additional Notes LOR @ 5.5  Patient identified. Risks/Benefits/Options discussed with patient including but not limited to bleeding, infection, nerve damage, paralysis, failed block, incomplete pain control, headache, blood pressure changes, nausea, vomiting, reactions to medications, itching and postpartum back pain. Confirmed with bedside nurse the patient's most recent platelet count. Confirmed with patient that they are not currently taking any anticoagulation, have any bleeding history or any family history of bleeding disorders. Patient expressed understanding and wished to proceed. All questions were answered. Sterile technique was used throughout the entire procedure. Please see nursing notes for vital signs. Test dose was given through epidural catheter and negative prior to continuing to dose epidural or start infusion. Warning signs of high block given to the patient including shortness of breath, tingling/numbness in hands, complete motor block, or any concerning symptoms with instructions to call for help. Patient was given instructions  on fall risk and not to get out of bed. All questions and concerns addressed with instructions to call with any issues or inadequate analgesia.    Reason for block:procedure for pain

## 2015-11-12 NOTE — MAU Note (Signed)
Pt presents complaining of contractions since noon. Denies bleeding or leaking of fluid. Reports good fetal movement.

## 2015-11-12 NOTE — H&P (Signed)
[name] is a 19 y.o. female G65P0101 with IUP at [redacted]w[redacted]d presenting for contractions . Pt states she has been having regular, every 2-3 minutes contractions, associated with none vaginal bleeding.  Membranes are ruptured, meconium stained (SROM in MAU at 1915), with active fetal movement.   [facility] 16 w[facility], transfer from HD  Prenatal History/Complications:  Hx PTD at 33 weeks Received 17p this pregnancy  Past Medical History: Past Medical History  Diagnosis Date  . No pertinent past medical history   . Medical history non-contributory     Past Surgical History: Past Surgical History  Procedure Laterality Date  . No past surgeries      Obstetrical History: OB History    Gravida Para Term Preterm AB TAB SAB Ectopic Multiple Living        Gynecological History:   Social History: Social History   Social History  . Marital Status: Single    Spouse Name: N/A  . Number of Children: N/A  . Years of Education: N/A   Social History Main Topics  . Smoking status: Never Smoker   . Smokeless tobacco: Never Used  . Alcohol Use: No  . Drug Use: No  . Sexual Activity: No   Other Topics Concern  . None   Social History Narrative    Family History: History reviewed. No pertinent family history.  Allergies: No Known Allergies  Prescriptions prior to admission  Medication Sig Dispense Refill Last Dose  . Prenatal Vit-Fe Fumarate-FA (PRENATAL VITAMINS PLUS) 27-1 MG TABS Take 1 tablet by mouth daily. 30 tablet 6 Taking     Prenatal Transfer Tool  Maternal Diabetes: No Genetic Screening: Declined Maternal Ultrasounds/Referrals: Normal Fetal Ultrasounds or other Referrals:  None Maternal Substance Abuse:  No Significant Maternal Medications:  None Significant Maternal Lab Results: Lab values include: Group B Strep negative     Review of Systems   Constitutional: Negative for fever and chills Eyes: Negative for visual  disturbances Respiratory: Negative for shortness of breath, dyspnea Cardiovascular: Negative for chest pain or palpitations  Gastrointestinal: Negative for vomiting, diarrhea and constipation.  POSITIVE for abdominal pain (contractions) Genitourinary: Negative for dysuria and urgency Musculoskeletal: Negative for back pain, joint pain, myalgias  Neurological: Negative for dizziness and headaches      Blood pressure 112/75, pulse 111, temperature 97.9 F (36.6 C), temperature source Oral, resp. rate 22, last menstrual period 02/02/2015. General appearance: alert, cooperative and mild distress Lungs: clear to auscultation bilaterally Heart: regular rate and rhythm Abdomen: soft, non-tender; bowel sounds normal Pelvic: leaking mec stained fluid Extremities: Homans sign is negative, no sign of DVT DTR's 2+ Presentation: cephalic Fetal monitoring  Baseline: 140 bpm, Variability: Good {> 6 bpm), Accelerations: Reactive and Decelerations: Absent Uterine activity  Mod to strong q 2-3 mintes  Dilation: 3 Effacement (%): 90 Station: -2 Exam by:: Edger House, RN   Prenatal labs: ABO, Rh: AB/Positive/-- (06/27 0000) Antibody: Negative (06/27 0000) Rubella: !Error! RPR: NON REAC (10/06 1025)  HBsAg: Negative (06/27 0000)  HIV: NONREACTIVE (10/06 1025)  GBS: Negative (12/12 0000)    Clinic [facility] Prenatal Labs  Dating 16 week [place] Blood type: AB/Positive/-- (06/27 0000)   Genetic Screen declined Antibody:Negative (06/27 0000)  Anatomic [place] Normal Rubella: Immune (06/27 0000)  GTT Third trimester: 152 3 hour 67-136-87-49 RPR: Nonreactive (06/27 0000)   Flu vaccine 07/23/15 given  HBsAg: Negative (06/27 0000)   TDaP vaccine  08/27/15  HIV: Non-reactive   Baby Food  Both  GBS: negative  Contraception Nexplanon desired Pap: too early -- age 19  Circumcision no   Pediatrician [facility]   Support  Person        No results found for this or any previous visit (from the past 24 hour(s)).  Assessment: [name] is a 19 y.o. G2P0101 with an IUP at 4057w0d presenting for early labor/SROM  Plan: #Labor: expectant managment #Pain:  IV/epidural #FWB Cat 1 #ID: GBS: neg  #MOF:  Breast and bottle #MOC: Nexplanon #Circ: no   CRESENZO-DISHMAN,Gannon Heinzman 11/12/2015, 7:48 PM

## 2015-11-12 NOTE — Anesthesia Preprocedure Evaluation (Signed)
Anesthesia Evaluation  Patient identified by MRN, date of birth, ID band Patient awake    Reviewed: Allergy & Precautions, NPO status , Patient's Chart, lab work & pertinent test results  Airway Mallampati: II  TM Distance: >3 FB Neck ROM: Full    Dental  (+) Teeth Intact   Pulmonary neg pulmonary ROS,    breath sounds clear to auscultation       Cardiovascular negative cardio ROS   Rhythm:Regular Rate:Normal     Neuro/Psych negative neurological ROS  negative psych ROS   GI/Hepatic negative GI ROS, Neg liver ROS,   Endo/Other  negative endocrine ROS  Renal/GU negative Renal ROS  negative genitourinary   Musculoskeletal negative musculoskeletal ROS (+)   Abdominal   Peds negative pediatric ROS (+)  Hematology negative hematology ROS (+)   Anesthesia Other Findings   Reproductive/Obstetrics (+) Pregnancy                             Lab Results  Component Value Date   WBC 12.6* 11/12/2015   HGB 12.2 11/12/2015   HCT 36.4 11/12/2015   MCV 79.1 11/12/2015   PLT 267 11/12/2015   No results found for: INR, PROTIME   Anesthesia Physical Anesthesia Plan  ASA: II  Anesthesia Plan: Epidural   Post-op Pain Management:    Induction:   Airway Management Planned:   Additional Equipment:   Intra-op Plan:   Post-operative Plan:   Informed Consent: I have reviewed the patients History and Physical, chart, labs and discussed the procedure including the risks, benefits and alternatives for the proposed anesthesia with the patient or authorized representative who has indicated his/her understanding and acceptance.     Plan Discussed with: CRNA  Anesthesia Plan Comments:         Anesthesia Quick Evaluation

## 2015-11-13 ENCOUNTER — Encounter (HOSPITAL_COMMUNITY): Payer: Self-pay

## 2015-11-13 LAB — ABO/RH: ABO/RH(D): AB POS

## 2015-11-13 LAB — RPR: RPR: NONREACTIVE

## 2015-11-13 MED ORDER — ONDANSETRON HCL 4 MG/2ML IJ SOLN: 4.0000 mg | INTRAMUSCULAR | Status: DC | PRN

## 2015-11-13 MED ORDER — OXYCODONE-ACETAMINOPHEN 5-325 MG PO TABS: 1.0000 | ORAL_TABLET | ORAL | Status: DC | PRN

## 2015-11-13 MED ORDER — LANOLIN HYDROUS EX OINT: TOPICAL_OINTMENT | CUTANEOUS | Status: DC | PRN

## 2015-11-13 MED ORDER — WITCH HAZEL-GLYCERIN EX PADS: 1.0000 "application " | MEDICATED_PAD | CUTANEOUS | Status: DC | PRN

## 2015-11-13 MED ORDER — METHYLERGONOVINE MALEATE 0.2 MG/ML IJ SOLN: 0.2000 mg | INTRAMUSCULAR | Status: DC | PRN

## 2015-11-13 MED ORDER — ONDANSETRON HCL 4 MG PO TABS: 4.0000 mg | ORAL_TABLET | ORAL | Status: DC | PRN

## 2015-11-13 MED ORDER — FERROUS SULFATE 325 (65 FE) MG PO TABS
325.0000 mg | ORAL_TABLET | Freq: Two times a day (BID) | ORAL | Status: DC
  Administered 2015-11-13 (×2): 325 mg via ORAL
  Filled 2015-11-13 (×2): qty 1

## 2015-11-13 MED ORDER — ZOLPIDEM TARTRATE 5 MG PO TABS: 5.0000 mg | ORAL_TABLET | Freq: Every evening | ORAL | Status: DC | PRN

## 2015-11-13 MED ORDER — DIPHENHYDRAMINE HCL 25 MG PO CAPS: 25.0000 mg | ORAL_CAPSULE | Freq: Four times a day (QID) | ORAL | Status: DC | PRN

## 2015-11-13 MED ORDER — OXYTOCIN 40 UNITS IN LACTATED RINGERS INFUSION - SIMPLE MED: 62.5000 mL/h | INTRAVENOUS | Status: DC | PRN

## 2015-11-13 MED ORDER — PRENATAL MULTIVITAMIN CH
1.0000 | ORAL_TABLET | Freq: Every day | ORAL | Status: DC
  Administered 2015-11-13 – 2015-11-14 (×2): 1 via ORAL
  Filled 2015-11-13 (×2): qty 1

## 2015-11-13 MED ORDER — SIMETHICONE 80 MG PO CHEW: 80.0000 mg | CHEWABLE_TABLET | ORAL | Status: DC | PRN

## 2015-11-13 MED ORDER — BISACODYL 10 MG RE SUPP
10.0000 mg | Freq: Every day | RECTAL | Status: DC | PRN
Start: 2015-11-13 — End: 2015-11-14

## 2015-11-13 MED ORDER — BENZOCAINE-MENTHOL 20-0.5 % EX AERO
1.0000 | INHALATION_SPRAY | CUTANEOUS | Status: DC | PRN
Start: 2015-11-13 — End: 2015-11-14

## 2015-11-13 MED ORDER — DIBUCAINE 1 % RE OINT: 1.0000 "application " | TOPICAL_OINTMENT | RECTAL | Status: DC | PRN

## 2015-11-13 MED ORDER — OXYCODONE-ACETAMINOPHEN 5-325 MG PO TABS: 2.0000 | ORAL_TABLET | ORAL | Status: DC | PRN

## 2015-11-13 MED ORDER — TETANUS-DIPHTH-ACELL PERTUSSIS 5-2.5-18.5 LF-MCG/0.5 IM SUSP: 0.5000 mL | Freq: Once | INTRAMUSCULAR | Status: DC

## 2015-11-13 MED ORDER — IBUPROFEN 600 MG PO TABS
600.0000 mg | ORAL_TABLET | Freq: Four times a day (QID) | ORAL | Status: DC
  Administered 2015-11-13 – 2015-11-14 (×5): 600 mg via ORAL
  Filled 2015-11-13 (×6): qty 1

## 2015-11-13 MED ORDER — MEASLES, MUMPS & RUBELLA VAC ~~LOC~~ INJ
0.5000 mL | INJECTION | Freq: Once | SUBCUTANEOUS | Status: DC
  Filled 2015-11-13: qty 0.5

## 2015-11-13 MED ORDER — SENNOSIDES-DOCUSATE SODIUM 8.6-50 MG PO TABS
2.0000 | ORAL_TABLET | ORAL | Status: DC
  Administered 2015-11-14: 2 via ORAL
  Filled 2015-11-13: qty 2

## 2015-11-13 MED ORDER — ACETAMINOPHEN 325 MG PO TABS: 650.0000 mg | ORAL_TABLET | ORAL | Status: DC | PRN

## 2015-11-13 MED ORDER — FLEET ENEMA 7-19 GM/118ML RE ENEM: 1.0000 | ENEMA | Freq: Every day | RECTAL | Status: DC | PRN

## 2015-11-13 MED ORDER — METHYLERGONOVINE MALEATE 0.2 MG PO TABS: 0.2000 mg | ORAL_TABLET | ORAL | Status: DC | PRN

## 2015-11-13 NOTE — Lactation Note (Addendum)
This note was copied from the chart of [name]. Lactation Consultation Note LC visit at 17 hours of age.  Mom declines interpreter and understands some.  Mom reports her sister will be here later to interpret if needed.  LC assisted with getting deeper latch at the breast with baby STS and pillow support.  Spoon fed 1ml of EBM to baby after feeding as well.  [facility] given and discussed.  Encouraged to feed with early cues on demand.  Early newborn behavior discussed.  Hand expression demonstrated with colostrum visible.  Mom to call for assist as needed.  Revisited at 20 hours of age.  Baby latched to left breast with wide flanged mouth and good strong sucking for about 8 minutes.  Mom offered other breast when baby stopped, baby refused and sleepy.  Mom denies questions.    Patient Name: [name] ZOXWR'UToday's Date: 11/13/2015 Reason for consult: Initial assessment   Maternal Data Has patient been taught Hand Expression?: Yes Does the patient have breastfeeding experience prior to this delivery?: Yes  Feeding Feeding Type: Breast Fed Length of feed: 15 min  LATCH Score/Interventions Latch: Repeated attempts needed to sustain latch, nipple held in mouth throughout feeding, stimulation needed to elicit sucking reflex. Intervention(s): Adjust position;Assist with latch;Breast massage;Breast compression  Audible Swallowing: A few with stimulation Intervention(s): Skin to skin;Hand expression;Alternate breast massage  Type of Nipple: Everted at rest and after stimulation  Comfort (Breast/Nipple): Soft / non-tender     Hold (Positioning): Assistance needed to correctly position infant at breast and maintain latch.  LATCH Score: 7  Lactation Tools Discussed/Used     Consult Status Consult Status: Follow-up Date: 11/14/15 Follow-up type: In-patient    [name], [name] 11/13/2015, 4:56 PM

## 2015-11-13 NOTE — Progress Notes (Signed)
UR chart review completed.  

## 2015-11-13 NOTE — Progress Notes (Signed)
Post Partum Day 1 Subjective: no complaints, up ad lib, voiding and tolerating PO, small lochia, plans to breastfeed, Nexplanon  Objective: Blood pressure 102/74, pulse 92, temperature 98 F (36.7 C), temperature source Oral, resp. rate 18, height 5' (1.524 m), weight 61.236 kg (135 lb), last menstrual period 02/02/2015, SpO2 98 %, unknown if currently breastfeeding.  Physical Exam:  General: alert, cooperative and no distress Lochia:normal flow Chest: CTAB Heart: RRR no m/r/g Abdomen: +BS, soft, nontender,  Uterine Fundus: firm DVT Evaluation: No evidence of DVT seen on physical exam. Extremities: no edema   Recent Labs  11/12/15 2000  HGB 12.2  HCT 36.4    Assessment/Plan: Plan for discharge tomorrow and Lactation consult   LOS: 1 day   CRESENZO-DISHMAN,Hayley Horn 11/13/2015, 7:30 AM

## 2015-11-13 NOTE — Anesthesia Postprocedure Evaluation (Signed)
Anesthesia Post Note  Patient: [name]  Procedure(s) Performed: * No procedures listed *  Patient location during evaluation: Mother Baby Anesthesia Type: Epidural Level of consciousness: awake Pain management: pain level controlled Vital Signs Assessment: post-procedure vital signs reviewed and stable Respiratory status: spontaneous breathing Cardiovascular status: stable Postop Assessment: no headache, no backache, epidural receding, patient able to bend at knees, no signs of nausea or vomiting and adequate PO intake Anesthetic complications: no    Last Vitals:  Filed Vitals:   11/13/15 0205 11/13/15 0540  BP: 102/58 102/74  Pulse: 97 92  Temp: 36.7 C 36.7 C  Resp: 18 18    Last Pain:  Filed Vitals:   11/13/15 0655  PainSc: 0-No pain                 Ramsie Ostrander

## 2015-11-14 MED ORDER — IBUPROFEN 600 MG PO TABS: 600.0000 mg | ORAL_TABLET | Freq: Four times a day (QID) | ORAL | Status: DC

## 2015-11-14 MED ORDER — ACETAMINOPHEN 325 MG PO TABS: 650.0000 mg | ORAL_TABLET | ORAL | Status: DC | PRN

## 2015-11-14 MED ORDER — SENNOSIDES-DOCUSATE SODIUM 8.6-50 MG PO TABS: 1.0000 | ORAL_TABLET | Freq: Every evening | ORAL | Status: DC | PRN

## 2015-11-14 NOTE — Discharge Summary (Signed)
OB Discharge Summary     Patient Name: [name] DOB: 01-Nov-1996 MRN: [id number]  Date of admission: 11/12/2015 Delivering MD: Jacklyn ShellRESENZO-DISHMON, FRANCES   Date of discharge: 11/14/2015  Admitting diagnosis: 40 wk ctx Intrauterine pregnancy: 6222w0d     Secondary diagnosis:  Active Problems:   Active labor at term  Additional problems: none     Discharge diagnosis: Term Pregnancy Delivered                                                                                                Post partum procedures:none  Augmentation: none  Complications: None  Hospital course:  Onset of Labor With Vaginal Delivery     19 y.o. yo [id number] at 3122w0d was admitted in Latent Laboron 11/12/2015. Patient presented with contractions and SROM at home. She progressed without augmentation and delivered a few hours later. Intrapartum Procedures: Episiotomy: None [1]                                         Lacerations:  None [1]  Patient had a delivery of a Viable infant. 11/12/2015  Information for the patient's newborn:  Caleen EssexWin, Boy Zarai [[id number]][[id number]]       Pateint had an uncomplicated postpartum course.  She is ambulating, tolerating a regular diet, passing flatus, and urinating well. Patient is discharged home in stable condition on No discharge date for patient encounter.Marland Kitchen.    Physical exam  Filed Vitals:   11/13/15 0205 11/13/15 0540 11/13/15 1916 11/14/15 0559  BP: 102/58 102/74 94/57 98/67   Pulse: 97 92 86 65  Temp: 98.1 F (36.7 C) 98 F (36.7 C) 98.3 F (36.8 C) 98.3 F (36.8 C)  TempSrc:  Oral Oral Oral  Resp: 18 18 18 18   Height:      Weight:      SpO2: 100% 98% 100%    General: alert, cooperative and no distress Lochia: appropriate Uterine Fundus: firm Incision: N/A DVT Evaluation: No cords or calf tenderness. No significant calf/ankle edema. Labs: Lab Results  Component Value Date   WBC 12.6* 11/12/2015   HGB 12.2 11/12/2015   HCT 36.4 11/12/2015   MCV 79.1 11/12/2015   PLT 267 11/12/2015   CMP Latest Ref Rng 12/25/2011  Glucose 70 - 99 mg/dL 75  BUN 6 - 23 mg/dL 7  Creatinine 1.300.47 - 8.651.00 mg/dL 7.840.51  Sodium 696135 - 295145 mEq/L 140  Potassium 3.5 - 5.1 mEq/L 3.8  Chloride 96 - 112 mEq/L 101  CO2 19 - 32 mEq/L 23  Calcium 8.4 - 10.5 mg/dL 9.8  Total Protein 6.0 - 8.3 g/dL 9.0(H)  Total Bilirubin 0.3 - 1.2 mg/dL 0.5  Alkaline Phos 47 - 119 U/L 231(H)  AST 0 - 37 U/L 62(H)  ALT 0 - 35 U/L 110(H)    Discharge instruction: per After Visit Summary and "Baby and Me Booklet".  After visit meds:    Medication List    TAKE these medications        acetaminophen  325 MG tablet  Commonly known as:  TYLENOL  Take 2 tablets (650 mg total) by mouth every 4 (four) hours as needed (for pain scale < 4).     ibuprofen 600 MG tablet  Commonly known as:  ADVIL,MOTRIN  Take 1 tablet (600 mg total) by mouth every 6 (six) hours.     PRENATAL VITAMINS PLUS 27-1 MG Tabs  Take 1 tablet by mouth daily.     senna-docusate 8.6-50 MG tablet  Commonly known as:  Senokot-S  Take 1 tablet by mouth at bedtime as needed for mild constipation.        Diet: routine diet  Activity: Advance as tolerated. Pelvic rest for 6 weeks.   Outpatient follow up:6 weeks Follow up Appt:No future appointments. Follow up Visit:No Follow-up on file.  Postpartum contraception: Nexplanon  Newborn Data: Live born female  Birth Weight: 6 lb 3.8 oz (2830 g) APGAR: 9, 10  Baby Feeding: Bottle Disposition:home with mother   11/14/2015 Silvano Bilis, MD

## 2015-11-14 NOTE — Lactation Note (Signed)
This note was copied from the chart of Boy Cosandra Carvey. Lactation Consultation Note  Follow up visit made.  Mom just finished a feeding using football hold and baby sleeping.  Her breasts are full and leaking.  She is asking for a bottle of milk.  Attempted to explain to her that her milk is in and formula not necessary.  Manual pump given for prn use at home.  I will follow up with interpreter.  Patient Name: Terri EssexBoy Shala Steppe ZOXWR'UToday's Date: 11/14/2015     Maternal Data    Feeding Feeding Type: Breast Fed Length of feed: 6 min  LATCH Score/Interventions                      Lactation Tools Discussed/Used     Consult Status      Huston FoleyMOULDEN, Josie Mesa S 11/14/2015, 9:17 AM

## 2015-11-14 NOTE — Discharge Instructions (Signed)

## 2015-11-23 ENCOUNTER — Encounter: Payer: Medicaid Other | Admitting: Obstetrics & Gynecology

## 2015-12-24 ENCOUNTER — Ambulatory Visit: Payer: Medicaid Other | Admitting: Obstetrics and Gynecology

## 2016-01-01 ENCOUNTER — Ambulatory Visit (INDEPENDENT_AMBULATORY_CARE_PROVIDER_SITE_OTHER): Payer: Medicaid Other | Admitting: Obstetrics & Gynecology

## 2016-01-01 ENCOUNTER — Encounter: Payer: Self-pay | Admitting: Obstetrics & Gynecology

## 2016-01-01 NOTE — Progress Notes (Signed)
Subjective:     [name] is a 20 y.o. female who presents for a postpartum visit. She is 7 weeks postpartum following a spontaneous vaginal delivery. I have fully reviewed the prenatal and intrapartum course. The delivery was at 38 gestational weeks. Outcome: spontaneous vaginal delivery. Anesthesia: none. Postpartum course has been unremarkable. Baby's course has been fine. Baby is feeding by breast. Bleeding no bleeding. Bowel function is normal. Bladder function is normal. Patient is not sexually active. Contraception method is Depo-Provera injections. Postpartum depression screening: negative.  The following portions of the patient's history were reviewed and updated as appropriate: allergies, current medications, past family history, past medical history, past social history, past surgical history and problem list.  Review of Systems Pertinent items are noted in HPI.   Objective:    BP 98/63 mmHg  Pulse 81  Temp(Src) 97.7 F (36.5 C) (Oral)  Ht  (1.499 m)  Breastfeeding? Yes       Pt in NAD Exam deferred Assessment:     7 weeks postpartum exam. Pap smear not done at today's visit.   Plan:    1. Contraception: Nexplanon 2. She wants to f/u in March for Nexplanon prior to her [place] 3. Follow up in: 2-3 week or as needed.    Georjean Toya L. Harraway-Smith, M.D., Evern Core

## 2016-01-01 NOTE — Progress Notes (Signed)
Language Resources interpreter Georgeanna Lea

## 2016-01-13 ENCOUNTER — Encounter: Payer: Self-pay | Admitting: *Deleted

## 2016-02-03 ENCOUNTER — Ambulatory Visit: Payer: Medicaid Other | Admitting: Obstetrics & Gynecology

## 2016-02-25 ENCOUNTER — Encounter: Payer: Self-pay | Admitting: Family Medicine

## 2016-02-25 ENCOUNTER — Ambulatory Visit (INDEPENDENT_AMBULATORY_CARE_PROVIDER_SITE_OTHER): Payer: Medicaid Other | Admitting: Family Medicine

## 2016-02-25 VITALS — BP 101/64 | HR 79 | Temp 98.4°F | Ht 59.0 in | Wt 114.0 lb

## 2016-02-25 DIAGNOSIS — Z30017 Encounter for initial prescription of implantable subdermal contraceptive: Secondary | ICD-10-CM

## 2016-02-25 DIAGNOSIS — Z3202 Encounter for pregnancy test, result negative: Secondary | ICD-10-CM

## 2016-02-25 NOTE — Progress Notes (Signed)
  Nexplanon Insertion:  Patient given informed consent, signed copy in the chart, time out was performed. Pregnancy test was Neg. Appropriate time out taken.  Patient's Left arm was prepped and draped in the usual sterile fashion.. The ruler used to measure and mark insertion area.  Pt was prepped with alcohol swab and then injected with 5 cc of 2% lidocaine with epinephrine.  Pt was prepped with betadine, Implanon removed form packaging,  Device confirmed in needle, then inserted full length of needle and withdrawn per handbook instructions.  Device palpated by physician and patient.  Pt insertion site covered with Coban.   Minimal blood loss.  Pt tolerated the procedure well.

## 2016-02-25 NOTE — Patient Instructions (Signed)
Etonogestrel implant What is this medicine? ETONOGESTREL (et oh noe JES trel) is a contraceptive (birth control) device. It is used to prevent pregnancy. It can be used for up to 3 years. This medicine may be used for other purposes; ask your health care provider or pharmacist if you have questions. What should I tell my health care provider before I take this medicine? They need to know if you have any of these conditions: -abnormal vaginal bleeding -blood vessel disease or blood clots -cancer of the breast, cervix, or liver -depression -diabetes -gallbladder disease -headaches -heart disease or recent heart attack -high blood pressure -high cholesterol -kidney disease -liver disease -renal disease -seizures -tobacco smoker -an unusual or allergic reaction to etonogestrel, other hormones, anesthetics or antiseptics, medicines, foods, dyes, or preservatives -pregnant or trying to get pregnant -breast-feeding How should I use this medicine? This device is inserted just under the skin on the inner side of your upper arm by a health care professional. Talk to your pediatrician regarding the use of this medicine in children. Special care may be needed. Overdosage: If you think you have taken too much of this medicine contact a poison control center or emergency room at once. NOTE: This medicine is only for you. Do not share this medicine with others. What if I miss a dose? This does not apply. What may interact with this medicine? Do not take this medicine with any of the following medications: -amprenavir -bosentan -fosamprenavir This medicine may also interact with the following medications: -barbiturate medicines for inducing sleep or treating seizures -certain medicines for fungal infections like ketoconazole and itraconazole -griseofulvin -medicines to treat seizures like carbamazepine, felbamate, oxcarbazepine, phenytoin,  topiramate -modafinil -phenylbutazone -rifampin -some medicines to treat HIV infection like atazanavir, indinavir, lopinavir, nelfinavir, tipranavir, ritonavir -St. John's wort This list may not describe all possible interactions. Give your health care provider a list of all the medicines, herbs, non-prescription drugs, or dietary supplements you use. Also tell them if you smoke, drink alcohol, or use illegal drugs. Some items may interact with your medicine. What should I watch for while using this medicine? This product does not protect you against HIV infection (AIDS) or other sexually transmitted diseases. You should be able to feel the implant by pressing your fingertips over the skin where it was inserted. Contact your doctor if you cannot feel the implant, and use a non-hormonal birth control method (such as condoms) until your doctor confirms that the implant is in place. If you feel that the implant may have broken or become bent while in your arm, contact your healthcare provider. What side effects may I notice from receiving this medicine? Side effects that you should report to your doctor or health care professional as soon as possible: -allergic reactions like skin rash, itching or hives, swelling of the face, lips, or tongue -breast lumps -changes in emotions or moods -depressed mood -heavy or prolonged menstrual bleeding -pain, irritation, swelling, or bruising at the insertion site -scar at site of insertion -signs of infection at the insertion site such as fever, and skin redness, pain or discharge -signs of pregnancy -signs and symptoms of a blood clot such as breathing problems; changes in vision; chest pain; severe, sudden headache; pain, swelling, warmth in the leg; trouble speaking; sudden numbness or weakness of the face, arm or leg -signs and symptoms of liver injury like dark yellow or brown urine; general ill feeling or flu-like symptoms; light-colored stools; loss of  appetite; nausea; right upper belly   pain; unusually weak or tired; yellowing of the eyes or skin -unusual vaginal bleeding, discharge -signs and symptoms of a stroke like changes in vision; confusion; trouble speaking or understanding; severe headaches; sudden numbness or weakness of the face, arm or leg; trouble walking; dizziness; loss of balance or coordination Side effects that usually do not require medical attention (Report these to your doctor or health care professional if they continue or are bothersome.): -acne -back pain -breast pain -changes in weight -dizziness -general ill feeling or flu-like symptoms -headache -irregular menstrual bleeding -nausea -sore throat -vaginal irritation or inflammation This list may not describe all possible side effects. Call your doctor for medical advice about side effects. You may report side effects to FDA at 1-800-FDA-1088. Where should I keep my medicine? This drug is given in a hospital or clinic and will not be stored at home. NOTE: This sheet is a summary. It may not cover all possible information. If you have questions about this medicine, talk to your doctor, pharmacist, or health care provider.    2016, Elsevier/Gold Standard. (2014-08-22 14:07:06)  

## 2016-10-27 ENCOUNTER — Ambulatory Visit: Payer: Medicaid Other | Admitting: Family Medicine

## 2017-02-09 ENCOUNTER — Other Ambulatory Visit (HOSPITAL_COMMUNITY)
Admission: RE | Admit: 2017-02-09 | Discharge: 2017-02-09 | Disposition: A | Discharge status: Home / Self Care | Payer: Medicaid Other | Source: Ambulatory Visit | Attending: Family Medicine | Admitting: Family Medicine

## 2017-02-09 ENCOUNTER — Ambulatory Visit (INDEPENDENT_AMBULATORY_CARE_PROVIDER_SITE_OTHER): Payer: Medicaid Other | Admitting: Family Medicine

## 2017-02-09 ENCOUNTER — Encounter: Payer: Self-pay | Admitting: Family Medicine

## 2017-02-09 VITALS — BP 87/66 | HR 89 | Temp 98.3°F | Ht 59.0 in | Wt 121.1 lb

## 2017-02-09 DIAGNOSIS — Z113 Encounter for screening for infections with a predominantly sexual mode of transmission: Secondary | ICD-10-CM | POA: Insufficient documentation

## 2017-02-09 DIAGNOSIS — Z124 Encounter for screening for malignant neoplasm of cervix: Secondary | ICD-10-CM

## 2017-02-09 DIAGNOSIS — Z309 Encounter for contraceptive management, unspecified: Secondary | ICD-10-CM

## 2017-02-09 DIAGNOSIS — Z01419 Encounter for gynecological examination (general) (routine) without abnormal findings: Secondary | ICD-10-CM

## 2017-02-09 DIAGNOSIS — Z0000 Encounter for general adult medical examination without abnormal findings: Secondary | ICD-10-CM | POA: Diagnosis not present

## 2017-02-09 NOTE — Progress Notes (Signed)
  Subjective:     [name] is a 21 y.o. female and is here for a comprehensive physical exam. The patient reports no problems.  Social History   Social History  . Marital status: Single    Spouse name: N/A  . Number of children: N/A  . Years of education: N/A   Occupational History  . Not on file.   Social History Main Topics  . Smoking status: Never Smoker  . Smokeless tobacco: Never Used  . Alcohol use No  . Drug use: No  . Sexual activity: No   Other Topics Concern  . Not on file   Social History Narrative  . No narrative on file   Health Maintenance  Topic Date Due  . PAP SMEAR  05/30/2016  . INFLUENZA VACCINE  06/21/2016  . TETANUS/TDAP  08/26/2025  . HIV Screening  Completed    The following portions of the patient's history were reviewed and updated as appropriate: allergies, current medications, past family history, past medical history, past social history, past surgical history and problem list.  Review of Systems Constitutional: negative Cardiovascular: negative Gastrointestinal: negative Genitourinary:negative Integument/breast: negative   Objective:    General appearance: alert and cooperative Neck: no adenopathy, no carotid bruit, no JVD, supple, symmetrical, trachea midline and thyroid not enlarged, symmetric, no tenderness/mass/nodules Back: symmetric, no curvature. ROM normal. No CVA tenderness. Lungs: clear to auscultation bilaterally Breasts: normal appearance, no masses or tenderness Heart: regular rate and rhythm, S1, S2 normal, no murmur, click, rub or gallop Abdomen: soft, non-tender; bowel sounds normal; no masses,  no organomegaly Pelvic: cervix normal in appearance, external genitalia normal, no adnexal masses or tenderness, no cervical motion tenderness, uterus normal size, shape, and consistency and vagina normal without discharge Extremities: extremities normal, atraumatic, no cyanosis or edema Pulses: 2+ and symmetric Skin: Skin  color, texture, turgor normal. No rashes or lesions    Assessment:    Healthy female exam.      Plan:  Pap Smear today  STI screening: Gc/Chlamydia, HIV, RPR  Follow up in 1 year or sooner if patient has concerns.

## 2017-02-09 NOTE — Progress Notes (Signed)
Clydie BraunKaren interpreter [employer] [facility] [phone]

## 2017-02-10 LAB — CYTOLOGY - PAP
Chlamydia: NEGATIVE
DIAGNOSIS: NEGATIVE
Neisseria Gonorrhea: NEGATIVE

## 2017-02-10 LAB — HEPATITIS B SURFACE ANTIGEN: HEP B S AG: NEGATIVE

## 2017-02-10 LAB — HIV ANTIBODY (ROUTINE TESTING W REFLEX): HIV Screen 4th Generation wRfx: NONREACTIVE

## 2017-02-10 LAB — RPR: RPR Ser Ql: NONREACTIVE

## 2017-02-10 LAB — HEPATITIS C ANTIBODY

## 2017-06-30 ENCOUNTER — Ambulatory Visit: Payer: Medicaid Other | Admitting: Family Medicine

## 2018-12-10 ENCOUNTER — Ambulatory Visit: Payer: Self-pay | Admitting: Family Medicine

## 2018-12-17 ENCOUNTER — Encounter: Payer: Self-pay | Admitting: Family Medicine

## 2018-12-17 ENCOUNTER — Ambulatory Visit: Payer: Medicaid Other | Admitting: Family Medicine

## 2018-12-17 VITALS — BP 104/70 | HR 78 | Wt 129.0 lb

## 2018-12-17 DIAGNOSIS — Z975 Presence of (intrauterine) contraceptive device: Secondary | ICD-10-CM

## 2018-12-17 NOTE — Progress Notes (Signed)
Patient here for nexplanon removal. Nexplanon still okay until April. Removal today offered, but patient would like to wait until April.

## 2019-01-01 ENCOUNTER — Telehealth: Payer: Self-pay | Admitting: Family Medicine

## 2019-01-01 NOTE — Telephone Encounter (Signed)
Attempted to call pt with her nexplanon removal appt. No answer, LVM with appt time and date. Advised to call if needing to reschedule or cancel.

## 2019-02-20 ENCOUNTER — Telehealth: Payer: Self-pay | Admitting: Obstetrics and Gynecology

## 2019-02-20 NOTE — Telephone Encounter (Signed)
postpone/telephone--back up method??* [name]* Return for nexplanon removal, Beginning of April (before April 6th). Per Dr. Shawnie Pons.  Called the patient to inform of the cancellation due to the COVID19 restrictions.    The patient stated her birth control has expired and she needs to come in. Informed the patient a nurse or doctor will give her a call tomorrow however she can not come in to the office for an appointment. Updated the appointment notes.

## 2019-02-21 ENCOUNTER — Other Ambulatory Visit: Payer: Self-pay

## 2019-02-21 ENCOUNTER — Ambulatory Visit (INDEPENDENT_AMBULATORY_CARE_PROVIDER_SITE_OTHER): Payer: Medicaid Other | Admitting: Family Medicine

## 2019-02-21 DIAGNOSIS — Z975 Presence of (intrauterine) contraceptive device: Secondary | ICD-10-CM

## 2019-02-21 DIAGNOSIS — Z3046 Encounter for surveillance of implantable subdermal contraceptive: Secondary | ICD-10-CM

## 2019-02-21 DIAGNOSIS — Z789 Other specified health status: Secondary | ICD-10-CM

## 2019-02-21 MED ORDER — NORGESTIMATE-ETH ESTRADIOL 0.25-35 MG-MCG PO TABS: 1.0000 | ORAL_TABLET | Freq: Every day | ORAL | 11 refills | Status: DC

## 2019-02-21 NOTE — Progress Notes (Signed)
[employer] ID [phone]

## 2019-02-21 NOTE — Progress Notes (Signed)
   TELEHEALTH VIRTUAL GYNECOLOGY VISIT ENCOUNTER NOTE  I connected with [name] on 02/21/19 at  9:35 AM EDT by telephone at home and verified that I am speaking with the correct person using two identifiers.   I discussed the limitations, risks, security and privacy concerns of performing an evaluation and management service by telephone and the availability of in person appointments. I also discussed with the patient that there may be a patient responsible charge related to this service. The patient expressed understanding and agreed to proceed.   History:  [name] is a 23 y.o. G24P1102 female being evaluated today for contraception. She has a nexplanon in place that is supposed to be removed today. She denies any abnormal vaginal discharge, bleeding, pelvic pain or other concerns.       Past Medical History:  Diagnosis Date  . Medical history non-contributory   . No pertinent past medical history    Past Surgical History:  Procedure Laterality Date  . NO PAST SURGERIES     The following portions of the patient's history were reviewed and updated as appropriate: allergies, current medications, past family history, past medical history, past social history, past surgical history and problem list.   Health Maintenance:  Normal pap and negative HRHPV on 01/2017.   Review of Systems:  Pertinent items noted in HPI and remainder of comprehensive ROS otherwise negative.  Physical Exam:  Physical exam deferred due to nature of the encounter  Labs and Imaging No results found for this or any previous visit (from the past 336 hour(s)). No results found.    Assessment and Plan:     1. Nexplanon in place Will cover with contraception for the next couple of months. Patient to set reminder in phone.  2. Language barrier Interpreter used      I discussed the assessment and treatment plan with the patient. The patient was provided an opportunity to ask questions and all were answered. The  patient agreed with the plan and demonstrated an understanding of the instructions.   The patient was advised to call back or seek an in-person evaluation/go to the ED if the symptoms worsen or if the condition fails to improve as anticipated.  I provided 12 minutes of non-face-to-face time during this encounter.   Levie Heritage, DO Center for [employer], [facility]

## 2019-03-21 ENCOUNTER — Encounter

## 2019-04-19 ENCOUNTER — Other Ambulatory Visit: Payer: Self-pay

## 2019-04-19 ENCOUNTER — Ambulatory Visit (INDEPENDENT_AMBULATORY_CARE_PROVIDER_SITE_OTHER): Payer: Medicaid Other | Admitting: Obstetrics and Gynecology

## 2019-04-19 ENCOUNTER — Encounter: Payer: Self-pay | Admitting: Obstetrics and Gynecology

## 2019-04-19 DIAGNOSIS — Z30013 Encounter for initial prescription of injectable contraceptive: Secondary | ICD-10-CM | POA: Diagnosis not present

## 2019-04-19 DIAGNOSIS — Z309 Encounter for contraceptive management, unspecified: Secondary | ICD-10-CM | POA: Insufficient documentation

## 2019-04-19 DIAGNOSIS — Z3046 Encounter for surveillance of implantable subdermal contraceptive: Secondary | ICD-10-CM

## 2019-04-19 MED ORDER — MEDROXYPROGESTERONE ACETATE 150 MG/ML IM SUSP: 150.0000 mg | INTRAMUSCULAR | 3 refills | Status: DC

## 2019-04-19 NOTE — Progress Notes (Signed)
Pt presents for Implanon removal. Inserted 02/2016. Switching to Depo Provera  Currently completing 1 month of OCP's.  Procedure: Informed consent obtained Area anesthized with 1 % Lidocaine Prepped with Betadine Implanon felt but deep. Stab incision made After about 30 minutes unable to grasp Implanon. Procedure abandoned Stri strips and dressing applied  Care instructions reviewed with pt  Depo Provera provided plus RX  Will have the pt return in 10-14 days for reeval with me and Dr Vergie Living to see if in office removal is possible

## 2019-04-22 DIAGNOSIS — Z3046 Encounter for surveillance of implantable subdermal contraceptive: Secondary | ICD-10-CM | POA: Diagnosis not present

## 2019-04-22 DIAGNOSIS — Z30013 Encounter for initial prescription of injectable contraceptive: Secondary | ICD-10-CM | POA: Diagnosis not present

## 2019-04-22 MED ORDER — MEDROXYPROGESTERONE ACETATE 150 MG/ML IM SUSP
150.0000 mg | Freq: Once | INTRAMUSCULAR | Status: AC
  Administered 2019-04-22: 150 mg via INTRAMUSCULAR

## 2019-04-22 NOTE — Addendum Note (Signed)
Addended by: Henrietta Dine on: 04/22/2019 09:06 AM   Modules accepted: Orders

## 2019-04-29 ENCOUNTER — Ambulatory Visit: Payer: Medicaid Other | Admitting: Obstetrics and Gynecology

## 2019-05-09 ENCOUNTER — Ambulatory Visit: Payer: Medicaid Other | Admitting: Family Medicine

## 2019-05-20 ENCOUNTER — Encounter: Payer: Self-pay | Admitting: Family Medicine

## 2019-05-20 ENCOUNTER — Other Ambulatory Visit: Payer: Self-pay

## 2019-05-20 ENCOUNTER — Ambulatory Visit (INDEPENDENT_AMBULATORY_CARE_PROVIDER_SITE_OTHER): Payer: Medicaid Other | Admitting: Family Medicine

## 2019-05-20 ENCOUNTER — Telehealth: Payer: Self-pay | Admitting: Family Medicine

## 2019-05-20 VITALS — BP 103/71 | HR 78 | Wt 133.6 lb

## 2019-05-20 DIAGNOSIS — Z3046 Encounter for surveillance of implantable subdermal contraceptive: Secondary | ICD-10-CM | POA: Diagnosis present

## 2019-05-20 NOTE — Progress Notes (Signed)
   Nexplanon Removal:  Patient given informed consent for removal of her Implanon, time out was performed.  Signed copy in the chart.  Appropriate time out taken. Nexplanon site identified - it was very deep, but still palpable.  Area prepped in usual sterile fashon. 44mL of 1% lidocaine was used to anesthetize the area at the distal end of the implant. A small stab incision was made right beside the implant on the distal portion.  The nexplanon rod was grasped using hemostats and removed without difficulty.  There was less than 3 cc blood loss. There were no complications.  A small amount of antibiotic ointment and steri-strips were applied over the small incision.  A pressure bandage was applied to reduce any bruising.  The patient tolerated the procedure well and was given post procedure instructions.

## 2019-05-20 NOTE — Telephone Encounter (Signed)
Attempted to call patient about her appointment scheduled for 05/20/19.  Assisted with the help of the Interpreter [phone]. Left a detailed message about her visit today.

## 2019-05-28 ENCOUNTER — Encounter: Payer: Self-pay | Admitting: *Deleted

## 2019-08-05 ENCOUNTER — Telehealth: Payer: Self-pay | Admitting: Family Medicine

## 2019-08-05 NOTE — Telephone Encounter (Signed)
Patient called requesting a call from clinical staff to receive the pill. She canceled her appointment because she doesn't want it.

## 2019-08-06 ENCOUNTER — Telehealth: Payer: Self-pay | Admitting: Obstetrics & Gynecology

## 2019-08-06 NOTE — Telephone Encounter (Signed)
The patient stated she would like a refill on her birth control pills. She would like the refill sent to [facility] on [facility].

## 2019-08-08 ENCOUNTER — Telehealth: Payer: Self-pay | Admitting: Family Medicine

## 2019-08-08 NOTE — Telephone Encounter (Signed)
Patient is requesting a call back from the clinical staff ASAP.

## 2019-08-08 NOTE — Telephone Encounter (Signed)
See other telephone note dated 08/05/19

## 2019-08-08 NOTE — Telephone Encounter (Signed)
I called Marivel with [employer] [phone] and left a message I was returning her call ; she may call us back again if needed.  Laityn Bensen,RN

## 2019-08-19 ENCOUNTER — Ambulatory Visit: Payer: Medicaid Other | Admitting: Family Medicine

## 2019-11-22 NOTE — L&D Delivery Note (Signed)
Delivery Note At 5:55 PM a viable female was delivered via  (Presentation: direct OA) after presentation for IOL-cholestasis, augmentation with cytotec and FB. Progressed to complete.  APGAR: 9, 9; weight pending.   Placenta status: intact, 3v cord.  Anesthesia: CSE Episiotomy: none Lacerations: none Suture Repair: n/a Est. Blood Loss (mL):   Mom to postpartum.  Baby to Couplet care / Skin to Skin.  Alric Seton 06/29/2020, 6:18 PM

## 2019-12-06 ENCOUNTER — Ambulatory Visit (INDEPENDENT_AMBULATORY_CARE_PROVIDER_SITE_OTHER): Payer: Medicaid Other

## 2019-12-06 ENCOUNTER — Encounter: Payer: Self-pay | Admitting: Obstetrics

## 2019-12-06 ENCOUNTER — Other Ambulatory Visit: Payer: Self-pay

## 2019-12-06 VITALS — BP 102/68 | HR 76 | Ht 59.0 in | Wt 140.0 lb

## 2019-12-06 DIAGNOSIS — Z3480 Encounter for supervision of other normal pregnancy, unspecified trimester: Secondary | ICD-10-CM | POA: Insufficient documentation

## 2019-12-06 DIAGNOSIS — Z3201 Encounter for pregnancy test, result positive: Secondary | ICD-10-CM

## 2019-12-06 LAB — POCT URINE PREGNANCY: Preg Test, Ur: POSITIVE — AB

## 2019-12-06 MED ORDER — BLOOD PRESSURE KIT DEVI: 1.0000 | 0 refills | Status: DC

## 2019-12-06 NOTE — Progress Notes (Signed)
[name] Interpreter [place] [phone]  Ms. Spikes presents today for UPT. She has no unusual complaints.  LMP:10/06/2019    OBJECTIVE: Appears well, in no apparent distress.  OB History    Gravida  3   Para  2   Term  1   Preterm  1   AB  0   Living  2     SAB  0   TAB  0   Ectopic  0   Multiple  0   Live Births  2          Home UPT Result:Positive In-Office UPT result:Positive  I have reviewed the patient's medical, obstetrical, social, and family histories, and medications.   ASSESSMENT: Positive pregnancy test  PLAN Prenatal care to be completed at: [facility]

## 2019-12-27 ENCOUNTER — Ambulatory Visit (INDEPENDENT_AMBULATORY_CARE_PROVIDER_SITE_OTHER): Payer: Medicaid Other | Admitting: Nurse Practitioner

## 2019-12-27 ENCOUNTER — Other Ambulatory Visit: Payer: Self-pay

## 2019-12-27 ENCOUNTER — Other Ambulatory Visit (HOSPITAL_COMMUNITY)
Admission: RE | Admit: 2019-12-27 | Discharge: 2019-12-27 | Disposition: A | Discharge status: Home / Self Care | Payer: Medicaid Other | Source: Ambulatory Visit | Attending: Nurse Practitioner | Admitting: Nurse Practitioner

## 2019-12-27 ENCOUNTER — Encounter: Payer: Self-pay | Admitting: Nurse Practitioner

## 2019-12-27 VITALS — BP 99/68 | HR 82 | Wt 137.0 lb

## 2019-12-27 DIAGNOSIS — Z3481 Encounter for supervision of other normal pregnancy, first trimester: Secondary | ICD-10-CM | POA: Diagnosis not present

## 2019-12-27 DIAGNOSIS — Z23 Encounter for immunization: Secondary | ICD-10-CM | POA: Diagnosis not present

## 2019-12-27 DIAGNOSIS — Z3480 Encounter for supervision of other normal pregnancy, unspecified trimester: Secondary | ICD-10-CM

## 2019-12-27 DIAGNOSIS — Z3A11 11 weeks gestation of pregnancy: Secondary | ICD-10-CM

## 2019-12-27 MED ORDER — VITAFOL ULTRA 29-0.6-0.4-200 MG PO CAPS: 1.0000 | ORAL_CAPSULE | Freq: Every day | ORAL | 11 refills | Status: AC

## 2019-12-27 NOTE — Progress Notes (Signed)
Subjective:   [name] is a 24 y.o. D6D2550 at 72w5dby LMP being seen today for her first obstetrical visit.  Her obstetrical history is significant for Language barrier. Patient does intend to breast feed. Pregnancy history fully reviewed. Video interpreter used for the entire visit.  Patient reports occasional vomiting, but nothig severe and not every day.  HISTORY: OB History  Gravida Para Term Preterm AB Living  _0 0 2  SAB TAB Ectopic Multiple Live Births  0 0 0 0 2    # Outcome Date GA Lbr Len/2nd Weight Sex Delivery Anes PTL Lv  3 Current           2 Term 11/12/15 328w0d1:30 / 00:11 6 lb 3.8 oz (2.83 kg) M Vag-Spont EPI  LIV     Birth Comments: [place], otherwise WNL     Name: WIMardene Sayer   Apgar1: 9  Apgar5: 10  1 Preterm 07/06/11 3352w0d:40 / 01:52 4 lb 5.1 oz (1.96 kg) F Vag-Spont None  LIV     Name: WINFarrell OursPast Medical History:  Diagnosis Date  . Medical history non-contributory   . No pertinent past medical history    Past Surgical History:  Procedure Laterality Date  . NO PAST SURGERIES     History reviewed. No pertinent family history. Social History   Tobacco Use  . Smoking status: Never Smoker  . Smokeless tobacco: Never Used  Substance Use Topics  . Alcohol use: No  . Drug use: No   No Known Allergies Current Outpatient Medications on File Prior to Visit  Medication Sig Dispense Refill  . Blood Pressure Monitoring (BLOOD PRESSURE KIT) DEVI 1 kit by Does not apply route once a week. Check Blood Pressure regularly and record readings into the Babyscripts App.  Large Cuff.  DX O90.0 1 each 0  . norgestimate-ethinyl estradiol (ORTHO-CYCLEN,SPRINTEC,PREVIFEM) 0.25-35 MG-MCG tablet Take 1 tablet by mouth daily. (Patient not taking: Reported on 05/20/2019) 1 Package 11   No current facility-administered medications on file prior to visit.     Exam   Vitals:   12/27/19 0958  BP: 99/68  Pulse: 82  Weight: 137 lb (62.1 kg)    Fetal Heart Rate (bpm): 170  Uterus:  Fundal Height: 11 cm  Pelvic Exam: Perineum: no hemorrhoids, normal perineum   Vulva: normal external genitalia, no lesions   Vagina:  normal mucosa, normal discharge   Cervix: no lesions and normal, pap smear done.    Adnexa: normal adnexa and no mass, fullness, tenderness   Bony Pelvis: average  System: General: well-developed, well-nourished female in no acute distress   Breast:  normal appearance, no masses or tenderness   Skin: normal coloration and turgor, no rashes   Neurologic: oriented, normal, negative, normal mood   Extremities: normal strength, tone, and muscle mass, ROM of all joints is normal   HEENT extraocular movement intact and sclera clear, anicteric   Mouth/Teeth deferred   Neck supple and no masses, normal thyroid   Cardiovascular: regular rate and rhythm   Respiratory:  no respiratory distress, normal breath sounds   Abdomen: soft, non-tender; no masses,  no organomegaly     Assessment:   Pregnancy: [id number] Active Problem List   Diagnosis Date Noted  . Supervision of other normal pregnancy, antepartum 12/06/2019  . Language barrier 02/21/2019     Plan:  1. Supervision of other normal pregnancy, antepartum Doing well Depo desired  for contraception - wants more children after this pregnancy. BP cuff and babyscripts reviewed with client by the nurse  - Obstetric Panel, Including HIV - Culture, OB Urine - Genetic Screening - Cervicovaginal ancillary only( [place]) - Babyscripts Schedule Optimization - Enroll Patient in Babyscripts - Cytology - PAP( [place])  2. Flu vaccine need Given today - Flu Vaccine QUAD 36+ mos IM   Initial labs drawn. Continue prenatal vitamins. Genetic Screening discussed, NIPS: ordered. Ultrasound discussed; fetal anatomic survey: will order at next visit. Problem list reviewed and updated. The nature of [place] with  multiple MDs and other Advanced Practice Providers was explained to patient; also emphasized that residents, students are part of our team. Routine obstetric precautions reviewed. Return in about 5 weeks (around 01/31/2020) for in person ROB and AFP.  Total face-to-face time with patient: 40 minutes.  Over 50% of encounter was spent on counseling and coordination of care.     Earlie Server, FNP Family Nurse Practitioner, [facility] [employer], [facility] 12/27/2019 12:40 PM

## 2019-12-27 NOTE — Progress Notes (Signed)
NOB   Genetic Screening:Yes wants to know the gender    Flu Vaccine: Today.   Last Pap 02/09/2017 WNL   CC: None

## 2019-12-28 LAB — OBSTETRIC PANEL, INCLUDING HIV
Antibody Screen: NEGATIVE
Basophils Absolute: 0 10*3/uL (ref 0.0–0.2)
Basos: 1 %
EOS (ABSOLUTE): 0.2 10*3/uL (ref 0.0–0.4)
Eos: 2 %
HIV Screen 4th Generation wRfx: NONREACTIVE
Hematocrit: 35.8 % (ref 34.0–46.6)
Hemoglobin: 11.5 g/dL (ref 11.1–15.9)
Hepatitis B Surface Ag: NEGATIVE
Immature Grans (Abs): 0 10*3/uL (ref 0.0–0.1)
Immature Granulocytes: 0 %
Lymphocytes Absolute: 1.9 10*3/uL (ref 0.7–3.1)
Lymphs: 26 %
MCH: 24.7 pg — ABNORMAL LOW (ref 26.6–33.0)
MCHC: 32.1 g/dL (ref 31.5–35.7)
MCV: 77 fL — ABNORMAL LOW (ref 79–97)
Monocytes Absolute: 0.4 10*3/uL (ref 0.1–0.9)
Monocytes: 6 %
Neutrophils Absolute: 4.6 10*3/uL (ref 1.4–7.0)
Neutrophils: 65 %
Platelets: 307 10*3/uL (ref 150–450)
RBC: 4.65 x10E6/uL (ref 3.77–5.28)
RDW: 14.9 % (ref 11.7–15.4)
RPR Ser Ql: NONREACTIVE
Rh Factor: POSITIVE
Rubella Antibodies, IGG: 5.03 index (ref >0.99)
WBC: 7.1 10*3/uL (ref 3.4–10.8)

## 2019-12-30 LAB — CYTOLOGY - PAP: Diagnosis: NEGATIVE

## 2019-12-30 LAB — CERVICOVAGINAL ANCILLARY ONLY
Chlamydia: NEGATIVE
Comment: NEGATIVE
Comment: NORMAL
Neisseria Gonorrhea: NEGATIVE

## 2019-12-31 ENCOUNTER — Encounter: Payer: Self-pay | Admitting: Nurse Practitioner

## 2019-12-31 DIAGNOSIS — O2341 Unspecified infection of urinary tract in pregnancy, first trimester: Secondary | ICD-10-CM | POA: Insufficient documentation

## 2019-12-31 LAB — CULTURE, OB URINE

## 2019-12-31 LAB — URINE CULTURE, OB REFLEX

## 2020-01-06 ENCOUNTER — Encounter: Payer: Self-pay | Admitting: Nurse Practitioner

## 2020-01-10 ENCOUNTER — Encounter: Payer: Self-pay | Admitting: Nurse Practitioner

## 2020-01-13 ENCOUNTER — Encounter: Payer: Self-pay | Admitting: Nurse Practitioner

## 2020-01-13 DIAGNOSIS — D563 Thalassemia minor: Secondary | ICD-10-CM | POA: Insufficient documentation

## 2020-01-13 HISTORY — DX: Thalassemia minor: D56.3

## 2020-01-30 NOTE — Progress Notes (Signed)
Subjective:  [name] is a 24 y.o. [id number] at [redacted]w[redacted]d being seen today for ongoing prenatal care.  She is currently monitored for the following issues for this low-risk pregnancy and has Language barrier; Supervision of other normal pregnancy, antepartum; UTI in pregnancy, antepartum, first trimester; and Alpha thalassemia silent carrier on their problem list.  Patient reports no complaints.  Contractions: Not present. Vag. Bleeding: None.  Movement: Present. Denies leaking of fluid.   The following portions of the patient's history were reviewed and updated as appropriate: allergies, current medications, past family history, past medical history, past social history, past surgical history and problem list. Problem list updated.  Objective:   Vitals:   01/31/20 0937  BP: 100/67  Pulse: 80  Weight: 137 lb 12.8 oz (62.5 kg)    Fetal Status: Fetal Heart Rate (bpm): 150   Movement: Present     General:  Alert, oriented and cooperative. Patient is in no acute distress.  Skin: Skin is warm and dry. No rash noted.   Cardiovascular: Normal heart rate noted  Respiratory: Normal respiratory effort, no problems with respiration noted  Abdomen: Soft, gravid, appropriate for gestational age. Pain/Pressure: Absent     Pelvic: Vag. Bleeding: None     Cervical exam deferred        Extremities: Normal range of motion.  Edema: None  Mental Status: Normal mood and affect. Normal behavior. Normal judgment and thought content.   Urinalysis:      Assessment and Plan:  Pregnancy: G3P1102 at [redacted]w[redacted]d  1. Supervision of other normal pregnancy, antepartum - peds list given - anatomy scan scheduled today - AFP, Serum, Open Spina Bifida  2. Language barrier -[name] interpreter used for entire visit  3. Alpha thalassemia silent carrier -discussed results with patient, will consider genetics counseling - AMB MFM GENETICS REFERRAL  4. UTI in pregnancy, antepartum, first trimester - pt reports she was not sent  ABX for UTI, RX Macrobid sent   Preterm labor symptoms and general obstetric precautions including but not limited to vaginal bleeding, contractions, leaking of fluid and fetal movement were reviewed in detail with the patient. I discussed the assessment and treatment plan with the patient. The patient was provided an opportunity to ask questions and all were answered. The patient agreed with the plan and demonstrated an understanding of the instructions. The patient was advised to call back or seek an in-person office evaluation/go to MAU at [facility] for any urgent or concerning symptoms. Please refer to After Visit Summary for other counseling recommendations.  Return in about 4 weeks (around 02/28/2020) for in-person ROB/urine TOC, needs anatomy scan scheduled, no genetic counseling at this time.   Alya Smaltz, Odie Sera, NP

## 2020-01-31 ENCOUNTER — Encounter: Payer: Self-pay | Admitting: Obstetrics

## 2020-01-31 ENCOUNTER — Ambulatory Visit (INDEPENDENT_AMBULATORY_CARE_PROVIDER_SITE_OTHER): Payer: Medicaid Other | Admitting: Women's Health

## 2020-01-31 ENCOUNTER — Other Ambulatory Visit: Payer: Self-pay

## 2020-01-31 VITALS — BP 100/67 | HR 80 | Wt 137.8 lb

## 2020-01-31 DIAGNOSIS — O99012 Anemia complicating pregnancy, second trimester: Secondary | ICD-10-CM

## 2020-01-31 DIAGNOSIS — O2341 Unspecified infection of urinary tract in pregnancy, first trimester: Secondary | ICD-10-CM

## 2020-01-31 DIAGNOSIS — Z3480 Encounter for supervision of other normal pregnancy, unspecified trimester: Secondary | ICD-10-CM

## 2020-01-31 DIAGNOSIS — Z3A16 16 weeks gestation of pregnancy: Secondary | ICD-10-CM

## 2020-01-31 DIAGNOSIS — D563 Thalassemia minor: Secondary | ICD-10-CM

## 2020-01-31 DIAGNOSIS — Z789 Other specified health status: Secondary | ICD-10-CM

## 2020-01-31 MED ORDER — NITROFURANTOIN MONOHYD MACRO 100 MG PO CAPS: 100.0000 mg | ORAL_CAPSULE | Freq: Two times a day (BID) | ORAL | 0 refills | Status: DC

## 2020-01-31 NOTE — Patient Instructions (Addendum)
AREA PEDIATRIC/FAMILY PRACTICE PHYSICIANS  [facility] [address] [place], [place] [address] Phone - [phone]   Fax - [place] [address] [place], [place]  [address] Phone - [phone]   Fax - [phone]  [place] [address] [place] [address] [place], [place]  [address] Phone - [phone]   Fax - [phone]  [facility] [address] [place], [place]  [address] Phone - [phone]   Fax - [phone]  [place] [address] [place], [place]  [address] Phone - [phone]   Fax - [place] [address] [place], [place]  [address] Phone - [phone]   Fax - [place] [facility] [place] [address] [place], [place]  [address] Phone - [phone]   Fax - [phone]  [place] [address] [place], [place], [place]  [address] Phone - [phone]   Fax - [place] [address] [place], [place]  [address] Phone - [phone]   Fax - [phone] [facility] [place]. [address] [place], [place]  [address] Phone - [phone]   Fax - [phone]  [place] [address] [place], [place]  [address] Phone - [phone]   Fax - [phone]  [facility] [place], [place], [place]  [address] Phone - [phone]   Fax - [place] [address] [place], [place]  [address] Phone - [phone]   Fax - [phone]  [facility] [address] [place], [place]  [address] Phone - [place], [place]  [address] Phone - [phone]   Fax - [place] [address] [place], [place]  [address] Phone - [phone]   Fax - [phone]  [place] [address] [place], [place]  [address] Phone - [phone]   Fax - [place]. [place], [place]  [address] Phone - [phone]   Fax - [place], [place], [place]  [address] Phone - [phone]   Fax - [place] [address] [place], [place]  [address] Phone - [phone]   Fax - [phone]  [facility] [address] [place], [place]  [address] Phone - [phone]   Fax - [place] [address] [place], [place]  [address] Phone - [phone]   Fax - [phone]  [place] [address] [place], [place]  [address] Phone - [phone]   Fax - [phone] [place] [address] [place], [place]  [address] Phone - [phone]   Fax - [phone]  [place] [address] [place],   [address] Phone - [phone]   Fax - [place] [address] [place],   [address] Phone - [phone]   Fax [phone]    Thalassemia  Thalassemia is a blood disorder that causes a low level of red blood cells (anemia). This condition is passed from parent to child through abnormal genes (gene mutations). The mutations  make it hard for your body to make the protein in red blood cells (hemoglobin) that carries oxygen from your lungs to the rest of your body. Red blood cells do not live long without hemoglobin. Loss of red blood cells leads to anemia, which is the main symptom of thalassemia. There are two main types of thalassemia. The type depends on which part of the hemoglobin is affected.  Alpha thalassemia affects the alpha part of the  hemoglobin. This is caused by four genes. You could get two from each parent.  Beta thalassemia affects the beta part of the hemoglobin. This is caused by two genes. You could get one from each parent. Thalassemia can be mild or severe. It depends on how many gene mutations you are born with. The more gene mutations you get, the more severe the condition. A person who inherits just one gene will be a carrier of the condition (thalassemia trait). A person with thalassemia trait may not have any symptoms or may have only mild anemia. A person who inherits two or more genes can have thalassemia minor, thalassemia intermedia, or thalassemia major. Thalassemia is a lifelong condition. There is no cure, but treatment can control symptoms and manage the condition. What are the causes? Thalassemia is cause by gene mutations that are passed down through families. What increases the risk? You are more likely to develop this condition if:  You have a family history of thalassemia.  Your ancestors are from [place], [place], [place], [place], [place], or the [place] What are the signs or symptoms? The most common signs and symptoms of thalassemia are the signs and symptoms of anemia. They include:  Weakness.  Tiredness.  Pounding heartbeat.  Dizziness.  Headache.  Leg cramps.  Pale skin.  Confusion.  Shortness of breath. Other signs and symptoms can also occur. You may have:  Yellow eyes or skin, and dark urine (jaundice). The breakdown of red blood cells can cause a yellowing pigment (bilirubin) to build up in your blood.  Weak bones (osteoporosis) and bone fractures. This is because bones can weaken from the effort of making more hemoglobin.  An enlarged spleen. This can lead to a swollen belly. Your spleen can become enlarged from filtering dead red blood cells.  Frequent, severe infections. This occurs if your spleen and bone marrow become weak. These organs make white blood  cells that your body needs to fight infections. How is this diagnosed? Your health care provider may suspect thalassemia based on your signs and symptoms, especially if you have a family history of the condition. This condition may be diagnosed:  In childhood, if you have severe forms of thalassemia. This is because symptoms show early in life.  At birth. In the [place] babies are screened for this condition.  In adulthood, if you have thalassemia trait or thalassemia minor. This happens if symptoms of anemia start or if a routine blood test shows unexplained anemia. Blood tests can confirm a thalassemia diagnosis. Blood tests may show:  Low hemoglobin.  Low iron.  Abnormal hemoglobin.  Thalassemia gene mutations. You may need to see a health care provider who specializes in blood diseases (hematologist). How is this treated? Treatment for this condition depends on the type of thalassemia that you have:  If you have thalassemia trait or thalassemia minor, you may not need treatment. However, you may need treatment if you have thalassemia minor and you develop symptoms during an infection.  If you have thalassemia intermedia, you will have  symptoms that require treatment.  If you have major thalassemia, you will have serious symptoms that require regular treatment. Thalassemia treatment may include:  Donated blood (transfusions) to replace red blood cells.  Vitamin B (folic acid) supplements to help produce hemoglobin and red blood cells.  Medicines or injections to remove iron buildup (chelation). This can happen in people who have frequent transfusions. Iron overload can damage heart, liver, and brain cells.  In the case of severe thalassemia: ? The spleen may need to be removed if it becomes damaged. ? Stem cell or bone marrow transplants may be necessary to transplant cells that can make red blood cells. This may be done if transfusions are not working. Follow these  instructions at home: Eating and drinking   Follow instructions from your health care provider about eating or drinking restrictions. You may need to avoid foods or drinks that are high in iron or fortified with iron.  Eat foods that are high in fiber, such as fresh fruits and vegetables, whole grains, and beans. Limit foods that are high in fat and processed sugars, such as fried and sweet foods. Nutrition is important for preventing anemia. Activity  Return to your normal activities as told by your health care provider. Ask your health care provider what activities are safe for you.  Exercise is important for maintaining energy and strong bones. Ask your health care provider what amount and type of exercise is safe for you. General instructions   Take over-the-counter and prescription medicines only as told by your health care provider.  Keep all routine vaccinations and flu shots up to date to reduce your risk of infection.  Wash your hands frequently.  Do your best to avoid sick people, and stay out of crowds during cold and flu seasons.  Meet with a Dentist if you are or may become pregnant. A genetic counselor can explain the risks of passing thalassemia to a child.  Keep all follow-up visits as told by your health care provider. This is important. Contact a health care provider if:  You have signs or symptoms of anemia.  You have a fever or other signs of infection.  Your belly is swollen.  You have jaundice. Get help right away if:  You feel very weak or short of breath. Summary  Thalassemia is a blood disorder that causes anemia.  Thalassemia can range from mild to severe.  This condition is passed down through families.  There is no cure, but treatment can manage the symptoms and prevent anemia. This information is not intended to replace advice given to you by your health care provider. Make sure you discuss any questions you have with your health  care provider. Document Revised: 03/06/2018 Document Reviewed: 03/06/2018 Elsevier Patient Education  2020 [employer].

## 2020-02-02 LAB — AFP, SERUM, OPEN SPINA BIFIDA
AFP MoM: 0.78
AFP Value: 29.4 ng/mL
Gest. Age on Collection Date: 16.5 weeks
Maternal Age At EDD: 25.1 yr
OSBR Risk 1 IN: 10000
Test Results:: NEGATIVE
Weight: 137 [lb_av]

## 2020-02-18 ENCOUNTER — Ambulatory Visit (HOSPITAL_BASED_OUTPATIENT_CLINIC_OR_DEPARTMENT_OTHER): Payer: Medicaid Other | Admitting: Genetic Counselor

## 2020-02-18 ENCOUNTER — Encounter (HOSPITAL_COMMUNITY): Payer: Self-pay

## 2020-02-18 ENCOUNTER — Other Ambulatory Visit: Payer: Self-pay

## 2020-02-18 ENCOUNTER — Ambulatory Visit (HOSPITAL_COMMUNITY)
Admission: RE | Admit: 2020-02-18 | Discharge: 2020-02-18 | Disposition: A | Discharge status: Home / Self Care | Payer: Medicaid Other | Source: Ambulatory Visit | Attending: Women's Health | Admitting: Women's Health

## 2020-02-18 ENCOUNTER — Other Ambulatory Visit (HOSPITAL_COMMUNITY): Payer: Self-pay | Admitting: *Deleted

## 2020-02-18 ENCOUNTER — Ambulatory Visit (HOSPITAL_COMMUNITY): Payer: Medicaid Other | Admitting: *Deleted

## 2020-02-18 ENCOUNTER — Ambulatory Visit (HOSPITAL_COMMUNITY): Payer: Self-pay | Admitting: Women's Health

## 2020-02-18 DIAGNOSIS — D563 Thalassemia minor: Secondary | ICD-10-CM

## 2020-02-18 DIAGNOSIS — Z315 Encounter for genetic counseling: Secondary | ICD-10-CM

## 2020-02-18 DIAGNOSIS — Z3A19 19 weeks gestation of pregnancy: Secondary | ICD-10-CM | POA: Diagnosis not present

## 2020-02-18 DIAGNOSIS — Z148 Genetic carrier of other disease: Secondary | ICD-10-CM

## 2020-02-18 DIAGNOSIS — Z363 Encounter for antenatal screening for malformations: Secondary | ICD-10-CM | POA: Diagnosis not present

## 2020-02-18 DIAGNOSIS — O09219 Supervision of pregnancy with history of pre-term labor, unspecified trimester: Secondary | ICD-10-CM | POA: Diagnosis not present

## 2020-02-18 DIAGNOSIS — Z3480 Encounter for supervision of other normal pregnancy, unspecified trimester: Secondary | ICD-10-CM | POA: Insufficient documentation

## 2020-02-18 DIAGNOSIS — Z362 Encounter for other antenatal screening follow-up: Secondary | ICD-10-CM

## 2020-02-18 NOTE — Progress Notes (Signed)
02/18/2020  [name] [birth year] MRN: [id number] DOV: 02/18/2020  [name] presented to the [facility] for a genetics consultation regarding her carrier status for alpha-thalassemia. [name] came to her appointment alone due to COVID-19 visitor restrictions. This session was facilitated by [employer], ID# [phone], and [place], ID# [phone], as the call with the interpreter was dropped .  Indication for genetic counseling - Silent carrier for alpha-thalassemia  Prenatal history  [name] is a [id number], 24 y.o. female. Her current pregnancy has completed [redacted]w[redacted]d (Estimated Date of Delivery: 07/09/20).  [name] denied exposure to environmental toxins or chemical agents. She denied the use of alcohol, tobacco or street drugs. She denied significant viral illnesses, fevers, and bleeding during the course of her pregnancy. Her medical and surgical histories were noncontributory.  Family History  A three generation pedigree was drafted and reviewed. Both family histories were reviewed and found to be noncontributory for birth defects, intellectual disability, recurrent pregnancy loss, and known genetic conditions. [name] had limited information about her partner's family history; thus, risk assessment was limited.  The patient's ethnicity is [place]. The father of the pregnancy's ethnicity is [place]. Ashkenazi Jewish ancestry and consanguinity were denied. Pedigree will be scanned under Media.  Discussion  [name] had Horizon-14 carrier screening performed through [place]. The results of the screen identified her as a silent carrier for alpha-thalassemia (aa/a-). Alpha-thalassemia is different in its inheritance compared to other hemoglobinopathies as there are two copies of two alpha globin genes (HBA1 and HBA2) on each chromosome 16, or four alpha globin genes total (aa/aa). A person can be a carrier of one alpha gene mutation (aa/a-), also referred to as a "silent  carrier". A person who carries two alpha globin gene mutations can either carry them in cis (both on the same chromosome, denoted as aa/--) or in trans (on different chromosomes, denoted as a-/a-). Alpha-thalassemia carriers of two mutations in the cis configuration are more common in individuals with Asian ancestry.     There are several different forms of alpha-thalassemia. The most severe form of alpha-thalassemia, Hb Barts, is associated with an absence of alpha globin chain synthesis as a result of deletions of all four alpha globin genes (--/--).  Given that [name] is a silent carrier (aa/a-), her pregnancies would not be at increased risk for Hb Barts, even if her partner is a carrier for alpha-thalassemia, as she will always pass on at least one copy of the alpha globin gene to her children. Hemoglobin H (HbH) disease is caused by three deleted or dysfunctioning alpha globin alleles (a-/--) and is characterized by microcytic hypochromic hemolytic anemia, hepatosplenomegaly, mild jaundice, growth retardation, and sometimes thalassemia-like bone changes. Given [name]'s silent carrier status (aa/a-), the current fetus would only be at risk for HbH disease (a-/--), if her partner is a carrier for two alpha globin mutations in cis (aa/--). If this is the case, the risk for HbH disease in the pregnancy would be 1 in 4 (25%). If he is not a carrier, is a silent carrier, or is a carrier of alpha-thalassemia in trans, then the pregnancy would not be at increased risk for HbH disease. Based on the carrier frequency for alpha-thalassemia in the Asian population, [name] partner has a 1 in 20 chance of being any type of carrier for alpha-thalassemia. Thus, the couple has up to a 1 in 26 (1.25%) chance of having a child with alpha-thalassemia.  [name]'s carrier screening was negative for the other 13 conditions screened. Thus, her risk to be a carrier for these additional conditions (listed separately in the  laboratory report) has been reduced but not eliminated. This also significantly reduces her risk of having a child affected by one of these conditions. We discussed that carrier testing for alpha-thalassemia is recommended for [name]'s partner. [name] indicated that she is interested in pursuing partner carrier screening.  We also reviewed that [name] had Panorama NIPS through the laboratory [facility] that was low-risk for fetal aneuploidies. We reviewed that these results showed a less than 1 in 10,000 risk for trisomies 21, 18 and 13, and monosomy X (Turner syndrome).  In addition, the risk for triploidy and sex chromosome trisomies (47,XXX and 47,XXY) was also low. [name] elected to have cfDNA analysis for 22q11.2 deletion syndrome, which was also low risk (1 in 9000). We reviewed that while this testing identifies 94-99% of pregnancies with trisomy 59, trisomy 27, trisomy 3, sex chromosome aneuploidies, and triploidy, it is NOT diagnostic. A positive test result requires confirmation by CVS or amniocentesis, and a negative test result does not rule out a fetal chromosome abnormality. She also understands that this testing does not identify all genetic conditions.  A complete ultrasound was performed today prior to our visit. The ultrasound report will be sent under separate cover. There were no visualized fetal anomalies or markers suggestive of aneuploidy.   [name] was also counseled regarding diagnostic testing via amniocentesis. We discussed the technical aspects of the procedure and quoted up to a 1 in 500 (0.2%) risk for spontaneous pregnancy loss or other adverse pregnancy outcomes as a result of amniocentesis. Cultured cells from an amniocentesis sample allow for the visualization of a fetal karyotype, which can detect >99% of chromosomal aberrations. Chromosomal microarray can also be performed to identify smaller deletions or duplications of fetal chromosomal material. Amniocentesis could also  be performed to assess whether the baby is affected by alpha-thalassemia. After careful consideration, [name] declined amniocentesis at this time. She understands that amniocentesis is available at any point after 16 weeks of pregnancy and that she may opt to undergo the procedure at a later date should she change her mind.  Ms. Sonn was interested in pursuing alpha-thalassemia carrier screening for her partner, Bu [place]. However, she requested that I call her later to coordinate testing after she has had a chance to discuss this with her partner. I will be out of the office over the next several days, so we made a plan for me to contact Ms. Ghrist to facilitate testing once I return to the office.  I counseled Ms. Asfaw regarding the above risks and available options. The approximate face-to-face time with the genetic counselor was 35 minutes.  In summary:  Discussed carrier screening results and options for follow-up testing  Silent carrier for alpha-thalassemia  Desires partner carrier screening. I will call [name] to facilitate testing once I return to the office  Reviewed low-risk NIPS result  Reduction in risk for Down syndrome,trisomy 18,trisomy 13, sex chromosome aneuploidies, and 22q11.2deletionsyndrome  Reviewed results of ultrasound  No fetal anomalies or markers seen  Reduction in risk for fetal aneuploidy  Offered additional testing and screening  Declined amniocentesis  Reviewed family history concerns   Gershon Crane, MS, Aeronautical engineer

## 2020-02-26 ENCOUNTER — Ambulatory Visit (HOSPITAL_COMMUNITY): Payer: Medicaid Other

## 2020-02-28 ENCOUNTER — Encounter: Payer: Medicaid Other | Admitting: Women's Health

## 2020-02-28 NOTE — Progress Notes (Deleted)
Subjective:  [name] is a 24 y.o. X4D5686 at [redacted]w[redacted]d being seen today for ongoing prenatal care.  She is currently monitored for the following issues for this low-risk pregnancy and has Language barrier; Supervision of other normal pregnancy, antepartum; UTI in pregnancy, antepartum, first trimester; and Alpha thalassemia silent carrier on their problem list.  Patient reports {sx:14538}.   .  .   . Denies leaking of fluid.   The following portions of the patient's history were reviewed and updated as appropriate: allergies, current medications, past family history, past medical history, past social history, past surgical history and problem list. Problem list updated.  Objective:  There were no vitals filed for this visit.  Fetal Status:           General:  Alert, oriented and cooperative. Patient is in no acute distress.  Skin: Skin is warm and dry. No rash noted.   Cardiovascular: Normal heart rate noted  Respiratory: Normal respiratory effort, no problems with respiration noted  Abdomen: Soft, gravid, appropriate for gestational age.       Pelvic:       {Blank single:19197::"Cervical exam performed","Cervical exam deferred"}        Extremities: Normal range of motion.     Mental Status: Normal mood and affect. Normal behavior. Normal judgment and thought content.   Urinalysis:      Assessment and Plan:  Pregnancy: G3P1102 at [redacted]w[redacted]d  1. Supervision of other normal pregnancy, antepartum *** -f/u US for growth/anatomy scheduled 03/17/2020, pt aware  2. UTI in pregnancy, antepartum, first trimester *** - Urine Culture TOC today - pt reports she took ABX as prescribed  3. Alpha thalassemia silent carrier -genetic counseling performed 02/18/2020  4. Language barrier -[name] interpreter used for entire visit  Preterm labor symptoms and general obstetric precautions including but not limited to vaginal bleeding, contractions, leaking of fluid and fetal movement were reviewed in detail  with the patient. Please refer to After Visit Summary for other counseling recommendations.  No follow-ups on file.   [name], Odie Sera, NP

## 2020-03-02 ENCOUNTER — Telehealth (HOSPITAL_COMMUNITY): Payer: Self-pay | Admitting: Genetic Counselor

## 2020-03-02 NOTE — Telephone Encounter (Signed)
I called Ms. Zentz with the help of [place], [place] [phone] to facilitate alpha-thalassemia carrier screening for her partner, Bu [place]. Mr. Ellwood Sayers does not yet have insurance through his employer and qualifies for free testing through the laboratory [facility]'s Patient Assistance Program. I will email Ms. Ostroff a partially filled out copy of the Patient Assistance Program application for Mr. Doh to electronically sign and return back to me along with a copy of his 2019 tax forms. [facility] will be shipping a saliva kit to the couple's house in the next few days. Results will take 2-3 weeks to be returned from the time the laboratory receives Mr. Arlyss Repress sample. I will call the couple when results become available. Ms. Peasley confirmed that she had no further questions at this time.  Buelah Manis, MS, [facility] Genetic Counselor

## 2020-03-04 ENCOUNTER — Ambulatory Visit (INDEPENDENT_AMBULATORY_CARE_PROVIDER_SITE_OTHER): Payer: Medicaid Other | Admitting: Obstetrics

## 2020-03-04 ENCOUNTER — Encounter: Payer: Self-pay | Admitting: Obstetrics

## 2020-03-04 ENCOUNTER — Other Ambulatory Visit: Payer: Self-pay

## 2020-03-04 VITALS — BP 91/63 | HR 102 | Wt 141.0 lb

## 2020-03-04 DIAGNOSIS — Z789 Other specified health status: Secondary | ICD-10-CM

## 2020-03-04 DIAGNOSIS — O2312 Infections of bladder in pregnancy, second trimester: Secondary | ICD-10-CM

## 2020-03-04 DIAGNOSIS — Z3480 Encounter for supervision of other normal pregnancy, unspecified trimester: Secondary | ICD-10-CM

## 2020-03-04 DIAGNOSIS — N3000 Acute cystitis without hematuria: Secondary | ICD-10-CM

## 2020-03-04 DIAGNOSIS — D563 Thalassemia minor: Secondary | ICD-10-CM

## 2020-03-04 DIAGNOSIS — Z3A21 21 weeks gestation of pregnancy: Secondary | ICD-10-CM

## 2020-03-04 DIAGNOSIS — Z603 Acculturation difficulty: Secondary | ICD-10-CM

## 2020-03-04 NOTE — Progress Notes (Signed)
Subjective:  [name] is a 24 y.o. E3X5400 at [redacted]w[redacted]d being seen today for ongoing prenatal care.  She is currently monitored for the following issues for this low-risk pregnancy and has Language barrier; Supervision of other normal pregnancy, antepartum; UTI in pregnancy, antepartum, first trimester; and Alpha thalassemia silent carrier on their problem list.  Patient reports no complaints.  Contractions: Not present. Vag. Bleeding: None.  Movement: Present. Denies leaking of fluid.   The following portions of the patient's history were reviewed and updated as appropriate: allergies, current medications, past family history, past medical history, past social history, past surgical history and problem list. Problem list updated.  Objective:   Vitals:   03/04/20 0824  BP: 91/63  Pulse: (!) 102  Weight: 141 lb (64 kg)    Fetal Status:     Movement: Present     General:  Alert, oriented and cooperative. Patient is in no acute distress.  Skin: Skin is warm and dry. No rash noted.   Cardiovascular: Normal heart rate noted  Respiratory: Normal respiratory effort, no problems with respiration noted  Abdomen: Soft, gravid, appropriate for gestational age. Pain/Pressure: Absent     Pelvic:  Cervical exam deferred        Extremities: Normal range of motion.     Mental Status: Normal mood and affect. Normal behavior. Normal judgment and thought content.   Urinalysis:      Assessment and Plan:  Pregnancy: Q6P6195 at [redacted]w[redacted]d  1. Supervision of other normal pregnancy, antepartum  2. Acute cystitis without hematuria  - Culture, OB Urine  3. Language barrier  4. Alpha thalassemia silent carrier   Preterm labor symptoms and general obstetric precautions including but not limited to vaginal bleeding, contractions, leaking of fluid and fetal movement were reviewed in detail with the patient. Please refer to After Visit Summary for other counseling recommendations.   Return in about 4 weeks  (around 04/01/2020) for MyChart.   Brock Bad, MD  03/04/2020

## 2020-03-06 LAB — CULTURE, OB URINE

## 2020-03-06 LAB — URINE CULTURE, OB REFLEX

## 2020-03-14 ENCOUNTER — Other Ambulatory Visit: Payer: Self-pay

## 2020-03-16 MED ORDER — BLOOD PRESSURE KIT DEVI: 1.0000 | 0 refills | Status: DC

## 2020-03-17 ENCOUNTER — Encounter (HOSPITAL_COMMUNITY): Payer: Self-pay

## 2020-03-17 ENCOUNTER — Ambulatory Visit (HOSPITAL_COMMUNITY)
Admission: RE | Admit: 2020-03-17 | Discharge: 2020-03-17 | Disposition: A | Discharge status: Home / Self Care | Payer: Medicaid Other | Source: Ambulatory Visit | Attending: Obstetrics and Gynecology | Admitting: Obstetrics and Gynecology

## 2020-03-17 ENCOUNTER — Ambulatory Visit (HOSPITAL_COMMUNITY): Payer: Medicaid Other | Admitting: *Deleted

## 2020-03-17 ENCOUNTER — Other Ambulatory Visit: Payer: Self-pay

## 2020-03-17 DIAGNOSIS — Z3480 Encounter for supervision of other normal pregnancy, unspecified trimester: Secondary | ICD-10-CM | POA: Insufficient documentation

## 2020-03-17 DIAGNOSIS — O09212 Supervision of pregnancy with history of pre-term labor, second trimester: Secondary | ICD-10-CM

## 2020-03-17 DIAGNOSIS — Z148 Genetic carrier of other disease: Secondary | ICD-10-CM

## 2020-03-17 DIAGNOSIS — Z3A23 23 weeks gestation of pregnancy: Secondary | ICD-10-CM | POA: Diagnosis not present

## 2020-03-17 DIAGNOSIS — Z362 Encounter for other antenatal screening follow-up: Secondary | ICD-10-CM | POA: Diagnosis not present

## 2020-03-18 ENCOUNTER — Other Ambulatory Visit (HOSPITAL_COMMUNITY): Payer: Self-pay | Admitting: *Deleted

## 2020-03-18 ENCOUNTER — Telehealth (HOSPITAL_COMMUNITY): Payer: Self-pay | Admitting: Genetic Counselor

## 2020-03-18 DIAGNOSIS — O09899 Supervision of other high risk pregnancies, unspecified trimester: Secondary | ICD-10-CM

## 2020-03-18 NOTE — Telephone Encounter (Signed)
I called Ms. Jaso with the help of [place], [place] [phone] to follow-up regarding her partner Bu Doh's carrier screening. Mr. Ellwood Sayers submitted a saliva sample as Ms. Rising and I had discussed during her Genetic Counseling appointment on 02/18/20. However, the laboratory [facility]) informed me that Mr. Arlyss Repress sample tube was not labeled with any unique identifiers such as his name and date of birth, so the sample could not be processed. Additionally, Ms. Schachter told me that she emailed me photos of their tax forms; however, I never received this email.   We discussed that the laboratory could mail another saliva kit to the couple's house so that Mr. Doh could provide another sample. Ms. Leopard indicated that she recently bought a house and will be moving in the next couple of weeks. I offered to wait a couple of weeks before having a new kit sent to the couple's new address. However, given that she has a lot on her plate at the moment, Ms. Holton declined partner carrier screening. I apologized for the inconvenience and encouraged her to contact me if she is interested in pursuing partner testing in the future. She confirmed that she had no further questions.  Buelah Manis, MS, [facility] Genetic Counselor

## 2020-04-01 ENCOUNTER — Telehealth (INDEPENDENT_AMBULATORY_CARE_PROVIDER_SITE_OTHER): Payer: Medicaid Other | Admitting: Advanced Practice Midwife

## 2020-04-01 DIAGNOSIS — Z5329 Procedure and treatment not carried out because of patient's decision for other reasons: Secondary | ICD-10-CM

## 2020-04-01 NOTE — Progress Notes (Signed)
Patient did not answer for virtual visit.   Thressa Sheller DNP, CNM  04/01/20  11:49 AM

## 2020-04-06 ENCOUNTER — Telehealth (INDEPENDENT_AMBULATORY_CARE_PROVIDER_SITE_OTHER): Payer: Medicaid Other | Admitting: Obstetrics

## 2020-04-06 ENCOUNTER — Encounter: Payer: Self-pay | Admitting: Obstetrics

## 2020-04-06 DIAGNOSIS — O2342 Unspecified infection of urinary tract in pregnancy, second trimester: Secondary | ICD-10-CM

## 2020-04-06 DIAGNOSIS — Z3A26 26 weeks gestation of pregnancy: Secondary | ICD-10-CM

## 2020-04-06 DIAGNOSIS — Z3480 Encounter for supervision of other normal pregnancy, unspecified trimester: Secondary | ICD-10-CM

## 2020-04-06 NOTE — Progress Notes (Signed)
   TELEHEALTH VIRTUAL OBSTETRICS VISIT ENCOUNTER NOTE  I connected with [name] on 04/06/20 at  4:00 PM EDT by telephone at home and verified that I am speaking with the correct person using two identifiers.   I discussed the limitations, risks, security and privacy concerns of performing an evaluation and management service by telephone and the availability of in person appointments. I also discussed with the patient that there may be a patient responsible charge related to this service. The patient expressed understanding and agreed to proceed.  Subjective:  [name] is a 24 y.o. [id number] at [redacted]w[redacted]d being followed for ongoing prenatal care.  She is currently monitored for the following issues for this low-risk pregnancy and has Language barrier; Supervision of other normal pregnancy, antepartum; UTI in pregnancy, antepartum, first trimester; and Alpha thalassemia silent carrier on their problem list.  Patient reports no complaints. Reports fetal movement. Denies any contractions, bleeding or leaking of fluid.   The following portions of the patient's history were reviewed and updated as appropriate: allergies, current medications, past family history, past medical history, past social history, past surgical history and problem list.   Objective:   General:  Alert, oriented and cooperative.   Mental Status: Normal mood and affect perceived. Normal judgment and thought content.  Rest of physical exam deferred due to type of encounter  Assessment and Plan:  Pregnancy: G3P1102 at [redacted]w[redacted]d 1. Supervision of other normal pregnancy, antepartum   Preterm labor symptoms and general obstetric precautions including but not limited to vaginal bleeding, contractions, leaking of fluid and fetal movement were reviewed in detail with the patient.  I discussed the assessment and treatment plan with the patient. The patient was provided an opportunity to ask questions and all were answered. The patient agreed with  the plan and demonstrated an understanding of the instructions. The patient was advised to call back or seek an in-person office evaluation/go to MAU at [facility] for any urgent or concerning symptoms. Please refer to After Visit Summary for other counseling recommendations.   I provided 15 minutes of non-face-to-face time during this encounter.  Return in about 2 weeks (around 04/20/2020) for ROB, 2 hour OGTT.  Future Appointments  Date Time Provider Department Center  04/07/2020  7:45 AM [facility] NURSE [facility]  04/07/2020  7:45 AM [facility] US5 [facility]  04/24/2020  8:00 AM [facility] LAB [facility] None  04/24/2020 10:00 AM Constant, Peggy, MD [facility] None    Coral Ceo, MD Center for [facility], [facility] 04/06/2020

## 2020-04-07 ENCOUNTER — Other Ambulatory Visit: Payer: Self-pay | Admitting: *Deleted

## 2020-04-07 ENCOUNTER — Ambulatory Visit: Payer: Medicaid Other | Admitting: *Deleted

## 2020-04-07 ENCOUNTER — Other Ambulatory Visit: Payer: Self-pay

## 2020-04-07 ENCOUNTER — Ambulatory Visit (HOSPITAL_COMMUNITY): Payer: Medicaid Other | Attending: Obstetrics

## 2020-04-07 DIAGNOSIS — Z362 Encounter for other antenatal screening follow-up: Secondary | ICD-10-CM | POA: Diagnosis not present

## 2020-04-07 DIAGNOSIS — Z148 Genetic carrier of other disease: Secondary | ICD-10-CM | POA: Diagnosis not present

## 2020-04-07 DIAGNOSIS — Z3480 Encounter for supervision of other normal pregnancy, unspecified trimester: Secondary | ICD-10-CM | POA: Diagnosis present

## 2020-04-07 DIAGNOSIS — O365930 Maternal care for other known or suspected poor fetal growth, third trimester, not applicable or unspecified: Secondary | ICD-10-CM

## 2020-04-07 DIAGNOSIS — O365920 Maternal care for other known or suspected poor fetal growth, second trimester, not applicable or unspecified: Secondary | ICD-10-CM

## 2020-04-07 DIAGNOSIS — O09212 Supervision of pregnancy with history of pre-term labor, second trimester: Secondary | ICD-10-CM | POA: Diagnosis not present

## 2020-04-07 DIAGNOSIS — O09899 Supervision of other high risk pregnancies, unspecified trimester: Secondary | ICD-10-CM | POA: Insufficient documentation

## 2020-04-07 DIAGNOSIS — Z3A26 26 weeks gestation of pregnancy: Secondary | ICD-10-CM

## 2020-04-21 ENCOUNTER — Ambulatory Visit: Payer: Medicaid Other

## 2020-04-24 ENCOUNTER — Other Ambulatory Visit: Payer: Medicaid Other

## 2020-04-24 ENCOUNTER — Other Ambulatory Visit: Payer: Self-pay

## 2020-04-24 ENCOUNTER — Encounter: Payer: Self-pay | Admitting: Obstetrics and Gynecology

## 2020-04-24 ENCOUNTER — Ambulatory Visit (INDEPENDENT_AMBULATORY_CARE_PROVIDER_SITE_OTHER): Payer: Medicaid Other | Admitting: Obstetrics and Gynecology

## 2020-04-24 VITALS — BP 89/61 | HR 90 | Wt 146.0 lb

## 2020-04-24 DIAGNOSIS — Z3A29 29 weeks gestation of pregnancy: Secondary | ICD-10-CM

## 2020-04-24 DIAGNOSIS — O99013 Anemia complicating pregnancy, third trimester: Secondary | ICD-10-CM

## 2020-04-24 DIAGNOSIS — Z3480 Encounter for supervision of other normal pregnancy, unspecified trimester: Secondary | ICD-10-CM

## 2020-04-24 DIAGNOSIS — Z789 Other specified health status: Secondary | ICD-10-CM

## 2020-04-24 DIAGNOSIS — D563 Thalassemia minor: Secondary | ICD-10-CM

## 2020-04-24 DIAGNOSIS — O365990 Maternal care for other known or suspected poor fetal growth, unspecified trimester, not applicable or unspecified: Secondary | ICD-10-CM | POA: Insufficient documentation

## 2020-04-24 DIAGNOSIS — O365930 Maternal care for other known or suspected poor fetal growth, third trimester, not applicable or unspecified: Secondary | ICD-10-CM

## 2020-04-24 NOTE — Progress Notes (Signed)
ROB/GTT.  Reports no problems today.

## 2020-04-24 NOTE — Progress Notes (Signed)
   PRENATAL VISIT NOTE  Subjective:  [name] is a 24 y.o. [id number] at [redacted]w[redacted]d being seen today for ongoing prenatal care.  She is currently monitored for the following issues for this high-risk pregnancy and has Language barrier; Supervision of other normal pregnancy, antepartum; UTI in pregnancy, antepartum, first trimester; Alpha thalassemia silent carrier; and IUGR (intrauterine growth restriction) affecting care of mother on their problem list.  Patient reports no complaints.  Contractions: Not present. Vag. Bleeding: None.  Movement: Present. Denies leaking of fluid.   The following portions of the patient's history were reviewed and updated as appropriate: allergies, current medications, past family history, past medical history, past social history, past surgical history and problem list.   Objective:   Vitals:   04/24/20 0825  BP: (!) 89/61  Pulse: 90  Weight: 146 lb (66.2 kg)    Fetal Status: Fetal Heart Rate (bpm): 133 Fundal Height: 28 cm Movement: Present     General:  Alert, oriented and cooperative. Patient is in no acute distress.  Skin: Skin is warm and dry. No rash noted.   Cardiovascular: Normal heart rate noted  Respiratory: Normal respiratory effort, no problems with respiration noted  Abdomen: Soft, gravid, appropriate for gestational age.  Pain/Pressure: Absent     Pelvic: Cervical exam deferred        Extremities: Normal range of motion.  Edema: None  Mental Status: Normal mood and affect. Normal behavior. Normal judgment and thought content.   Assessment and Plan:  Pregnancy: G3P1102 at [redacted]w[redacted]d 1. Supervision of other normal pregnancy, antepartum Patient is doing well Third trimester labs with glucola today Patient undecided on contraception but is considering IUD vs BTL. Patient desires to discuss with husband before signing BTL form  2. Poor fetal growth affecting management of mother in third trimester, single or unspecified fetus EFW increased to 4.5% on  5/18 Continue dopplers and growth ultrasound per MFM  3. Language barrier [name] interpreter used  4. Alpha thalassemia silent carrier S/p genetic counseling  Preterm labor symptoms and general obstetric precautions including but not limited to vaginal bleeding, contractions, leaking of fluid and fetal movement were reviewed in detail with the patient. Please refer to After Visit Summary for other counseling recommendations.   Return in about 2 weeks (around 05/08/2020) for in person, ROB, High risk.  Future Appointments  Date Time Provider Department Center  04/24/2020 10:00 AM Mayli Covington, Gigi Gin, MD [facility] None  04/28/2020  7:45 AM [facility] NURSE [facility]  04/28/2020  7:45 AM [facility] US5 [facility]    Catalina Antigua, MD

## 2020-04-25 LAB — HIV ANTIBODY (ROUTINE TESTING W REFLEX): HIV Screen 4th Generation wRfx: NONREACTIVE

## 2020-04-25 LAB — GLUCOSE TOLERANCE, 2 HOURS W/ 1HR
Glucose, 1 hour: 121 mg/dL (ref 65–179)
Glucose, 2 hour: 124 mg/dL (ref 65–152)
Glucose, Fasting: 71 mg/dL (ref 65–91)

## 2020-04-25 LAB — CBC
Hematocrit: 32.5 % — ABNORMAL LOW (ref 34.0–46.6)
Hemoglobin: 9.9 g/dL — ABNORMAL LOW (ref 11.1–15.9)
MCH: 23.5 pg — ABNORMAL LOW (ref 26.6–33.0)
MCHC: 30.5 g/dL — ABNORMAL LOW (ref 31.5–35.7)
MCV: 77 fL — ABNORMAL LOW (ref 79–97)
Platelets: 336 10*3/uL (ref 150–450)
RBC: 4.21 x10E6/uL (ref 3.77–5.28)
RDW: 13.5 % (ref 11.7–15.4)
WBC: 10.5 10*3/uL (ref 3.4–10.8)

## 2020-04-25 LAB — RPR: RPR Ser Ql: NONREACTIVE

## 2020-04-28 ENCOUNTER — Ambulatory Visit: Payer: Medicaid Other | Attending: Obstetrics and Gynecology

## 2020-04-28 ENCOUNTER — Other Ambulatory Visit: Payer: Self-pay | Admitting: *Deleted

## 2020-04-28 ENCOUNTER — Other Ambulatory Visit: Payer: Self-pay

## 2020-04-28 ENCOUNTER — Ambulatory Visit: Payer: Medicaid Other | Admitting: *Deleted

## 2020-04-28 DIAGNOSIS — Z362 Encounter for other antenatal screening follow-up: Secondary | ICD-10-CM | POA: Diagnosis not present

## 2020-04-28 DIAGNOSIS — Z3480 Encounter for supervision of other normal pregnancy, unspecified trimester: Secondary | ICD-10-CM | POA: Insufficient documentation

## 2020-04-28 DIAGNOSIS — O365930 Maternal care for other known or suspected poor fetal growth, third trimester, not applicable or unspecified: Secondary | ICD-10-CM

## 2020-04-28 DIAGNOSIS — Z148 Genetic carrier of other disease: Secondary | ICD-10-CM

## 2020-04-28 DIAGNOSIS — O09213 Supervision of pregnancy with history of pre-term labor, third trimester: Secondary | ICD-10-CM

## 2020-04-28 DIAGNOSIS — Z3A29 29 weeks gestation of pregnancy: Secondary | ICD-10-CM

## 2020-05-12 ENCOUNTER — Ambulatory Visit: Payer: Medicaid Other

## 2020-05-12 ENCOUNTER — Ambulatory Visit: Payer: Medicaid Other | Attending: Obstetrics and Gynecology

## 2020-05-15 ENCOUNTER — Other Ambulatory Visit: Payer: Self-pay

## 2020-05-15 ENCOUNTER — Ambulatory Visit (INDEPENDENT_AMBULATORY_CARE_PROVIDER_SITE_OTHER): Payer: Medicaid Other | Admitting: Obstetrics & Gynecology

## 2020-05-15 VITALS — BP 92/64 | HR 91 | Wt 146.0 lb

## 2020-05-15 DIAGNOSIS — D563 Thalassemia minor: Secondary | ICD-10-CM

## 2020-05-15 DIAGNOSIS — L299 Pruritus, unspecified: Secondary | ICD-10-CM

## 2020-05-15 DIAGNOSIS — Z3A32 32 weeks gestation of pregnancy: Secondary | ICD-10-CM

## 2020-05-15 DIAGNOSIS — O26893 Other specified pregnancy related conditions, third trimester: Secondary | ICD-10-CM

## 2020-05-15 DIAGNOSIS — O365930 Maternal care for other known or suspected poor fetal growth, third trimester, not applicable or unspecified: Secondary | ICD-10-CM

## 2020-05-15 DIAGNOSIS — Z789 Other specified health status: Secondary | ICD-10-CM

## 2020-05-15 DIAGNOSIS — Z603 Acculturation difficulty: Secondary | ICD-10-CM

## 2020-05-15 DIAGNOSIS — Z3480 Encounter for supervision of other normal pregnancy, unspecified trimester: Secondary | ICD-10-CM

## 2020-05-15 MED ORDER — DIPHENHYDRAMINE HCL 25 MG PO TABS: 50.0000 mg | ORAL_TABLET | Freq: Four times a day (QID) | ORAL | 1 refills | Status: DC | PRN

## 2020-05-15 NOTE — Progress Notes (Signed)
ROB     CC: pt notes itching all over her body x 1 week now pt now has rashes as well.

## 2020-05-15 NOTE — Patient Instructions (Signed)

## 2020-05-15 NOTE — Progress Notes (Signed)
   PRENATAL VISIT NOTE  Subjective:  [name] is a 24 y.o. [id number] at [redacted]w[redacted]d being seen today for ongoing prenatal care.  She is currently monitored for the following issues for this high-risk pregnancy and has Language barrier; Supervision of other normal pregnancy, antepartum; UTI in pregnancy, antepartum, first trimester; Alpha thalassemia silent carrier; and IUGR (intrauterine growth restriction) affecting care of mother on their problem list.  Patient reports itching from mosquito bites both legs.  Contractions: Not present. Vag. Bleeding: None.  Movement: Present. Denies leaking of fluid.   The following portions of the patient's history were reviewed and updated as appropriate: allergies, current medications, past family history, past medical history, past social history, past surgical history and problem list.   Objective:   Vitals:   05/15/20 1005  BP: 92/64  Pulse: 91  Weight: 146 lb (66.2 kg)    Fetal Status: Fetal Heart Rate (bpm): 152   Movement: Present     General:  Alert, oriented and cooperative. Patient is in no acute distress.  Skin: Skin is warm and dry. No rash noted.   Cardiovascular: Normal heart rate noted  Respiratory: Normal respiratory effort, no problems with respiration noted  Abdomen: Soft, gravid, appropriate for gestational age.  Pain/Pressure: Absent     Pelvic: Cervical exam deferred        Extremities: Normal range of motion.  Edema: None  Mental Status: Normal mood and affect. Normal behavior. Normal judgment and thought content.   Assessment and Plan:  Pregnancy: G3P1102 at [redacted]w[redacted]d 1. Supervision of other normal pregnancy, antepartum Insect bites noted both legs excoriated from scratching - diphenhydrAMINE (BENADRYL) 25 MG tablet; Take 2 tablets (50 mg total) by mouth every 6 (six) hours as needed for itching.  Dispense: 30 tablet; Refill: 1 Supervision of other normal pregnancy, antepartum - Plan: diphenhydrAMINE (BENADRYL) 25 MG tablet  Poor  fetal growth affecting management of mother in third trimester, single or unspecified fetus  Alpha thalassemia silent carrier  Language barrier   Preterm labor symptoms and general obstetric precautions including but not limited to vaginal bleeding, contractions, leaking of fluid and fetal movement were reviewed in detail with the patient. Please refer to After Visit Summary for other counseling recommendations.  [place] scheduled Return in about 2 weeks (around 05/29/2020).  Future Appointments  Date Time Provider Department Center  05/19/2020 10:45 AM [facility]  05/19/2020 10:45 AM [facility] US4 [facility]  05/29/2020  7:45 AM [facility] NURSE [facility]  05/29/2020  7:45 AM [facility] US5 [facility]  05/29/2020  9:45 AM Conan Bowens, MD [facility] None    Scheryl Darter, MD

## 2020-05-19 ENCOUNTER — Ambulatory Visit: Payer: Medicaid Other | Attending: Obstetrics and Gynecology

## 2020-05-19 ENCOUNTER — Ambulatory Visit: Payer: Medicaid Other | Admitting: *Deleted

## 2020-05-19 ENCOUNTER — Other Ambulatory Visit: Payer: Self-pay

## 2020-05-19 ENCOUNTER — Other Ambulatory Visit: Payer: Self-pay | Admitting: *Deleted

## 2020-05-19 DIAGNOSIS — O09213 Supervision of pregnancy with history of pre-term labor, third trimester: Secondary | ICD-10-CM | POA: Diagnosis not present

## 2020-05-19 DIAGNOSIS — Z362 Encounter for other antenatal screening follow-up: Secondary | ICD-10-CM

## 2020-05-19 DIAGNOSIS — Z3A31 31 weeks gestation of pregnancy: Secondary | ICD-10-CM

## 2020-05-19 DIAGNOSIS — O365930 Maternal care for other known or suspected poor fetal growth, third trimester, not applicable or unspecified: Secondary | ICD-10-CM

## 2020-05-19 DIAGNOSIS — Z3689 Encounter for other specified antenatal screening: Secondary | ICD-10-CM

## 2020-05-19 DIAGNOSIS — Z148 Genetic carrier of other disease: Secondary | ICD-10-CM

## 2020-05-19 DIAGNOSIS — Z3480 Encounter for supervision of other normal pregnancy, unspecified trimester: Secondary | ICD-10-CM | POA: Insufficient documentation

## 2020-05-21 DIAGNOSIS — Z419 Encounter for procedure for purposes other than remedying health state, unspecified: Secondary | ICD-10-CM | POA: Diagnosis not present

## 2020-05-29 ENCOUNTER — Ambulatory Visit: Payer: Medicaid Other

## 2020-05-29 ENCOUNTER — Ambulatory Visit (INDEPENDENT_AMBULATORY_CARE_PROVIDER_SITE_OTHER): Payer: Medicaid Other | Admitting: Obstetrics and Gynecology

## 2020-05-29 ENCOUNTER — Ambulatory Visit: Payer: Medicaid Other | Attending: Obstetrics and Gynecology

## 2020-05-29 ENCOUNTER — Encounter: Payer: Self-pay | Admitting: Obstetrics and Gynecology

## 2020-05-29 ENCOUNTER — Other Ambulatory Visit: Payer: Self-pay

## 2020-05-29 VITALS — BP 94/64 | HR 94 | Wt 146.0 lb

## 2020-05-29 DIAGNOSIS — K831 Obstruction of bile duct: Secondary | ICD-10-CM

## 2020-05-29 DIAGNOSIS — Z3480 Encounter for supervision of other normal pregnancy, unspecified trimester: Secondary | ICD-10-CM

## 2020-05-29 DIAGNOSIS — D563 Thalassemia minor: Secondary | ICD-10-CM

## 2020-05-29 DIAGNOSIS — Z789 Other specified health status: Secondary | ICD-10-CM

## 2020-05-29 DIAGNOSIS — L299 Pruritus, unspecified: Secondary | ICD-10-CM

## 2020-05-29 DIAGNOSIS — Z3A32 32 weeks gestation of pregnancy: Secondary | ICD-10-CM

## 2020-05-29 DIAGNOSIS — O365930 Maternal care for other known or suspected poor fetal growth, third trimester, not applicable or unspecified: Secondary | ICD-10-CM

## 2020-05-29 DIAGNOSIS — O26893 Other specified pregnancy related conditions, third trimester: Secondary | ICD-10-CM

## 2020-05-29 DIAGNOSIS — Z603 Acculturation difficulty: Secondary | ICD-10-CM

## 2020-05-29 DIAGNOSIS — Z758 Other problems related to medical facilities and other health care: Secondary | ICD-10-CM

## 2020-05-29 NOTE — Patient Instructions (Signed)
You can use Aveeno oatmeal bath or lotion, or Calamine lotion. This may be found at any pharmacy.

## 2020-05-29 NOTE — Progress Notes (Signed)
° °  PRENATAL VISIT NOTE  Subjective:  [name] is a 24 y.o. [id number] at [redacted]w[redacted]d being seen today for ongoing prenatal care.  She is currently monitored for the following issues for this low-risk pregnancy and has Language barrier; Supervision of other normal pregnancy, antepartum; UTI in pregnancy, antepartum, first trimester; Alpha thalassemia silent carrier; and IUGR (intrauterine growth restriction) affecting care of mother on their problem list.  Patient reports itching on legs and back.  Contractions: Not present. Vag. Bleeding: None.  Movement: Present. Denies leaking of fluid.   The following portions of the patient's history were reviewed and updated as appropriate: allergies, current medications, past family history, past medical history, past social history, past surgical history and problem list.   Objective:   Vitals:   05/29/20 0939  BP: 94/64  Pulse: 94  Weight: 146 lb (66.2 kg)    Fetal Status: Fetal Heart Rate (bpm): 135   Movement: Present     General:  Alert, oriented and cooperative. Patient is in no acute distress.  Skin: Skin is warm and dry. No rash noted.   Cardiovascular: Normal heart rate noted  Respiratory: Normal respiratory effort, no problems with respiration noted  Abdomen: Soft, gravid, appropriate for gestational age.  Pain/Pressure: Absent     Pelvic: Cervical exam deferred        Extremities: Normal range of motion.  Edema: None  Mental Status: Normal mood and affect. Normal behavior. Normal judgment and thought content.   Assessment and Plan:  Pregnancy: G3P1102 at [redacted]w[redacted]d 1. Supervision of other normal pregnancy, antepartum  2. Language barrier [name] interpretor used  3. Alpha thalassemia silent carrier  4. Poor fetal growth affecting management of mother in third trimester, single or unspecified fetus Missed growth [place] this am, scheduled due to IUGR however with revised due date, growth @ 34th%tile - has [place] f/u appt for next Friday  5.  Itching Insect bites in various stages of healing over the legs and back Taking benadryl but doesn't like it because it makes her slepy - try calamine lotion or aveeno oatmeal bath - Comprehensive metabolic panel - Bile acids, total  Preterm labor symptoms and general obstetric precautions including but not limited to vaginal bleeding, contractions, leaking of fluid and fetal movement were reviewed in detail with the patient. Please refer to After Visit Summary for other counseling recommendations.   Return in about 2 weeks (around 06/12/2020) for high OB, in person.  Future Appointments  Date Time Provider Department Center  06/05/2020  8:30 AM [facility] NURSE [facility]  06/05/2020  8:45 AM [facility] US5 [facility]    Conan Bowens, MD

## 2020-05-31 LAB — COMPREHENSIVE METABOLIC PANEL
ALT: 167 IU/L — ABNORMAL HIGH (ref 0–32)
AST: 167 IU/L — ABNORMAL HIGH (ref 0–40)
Albumin/Globulin Ratio: 1.2 (ref 1.2–2.2)
Albumin: 3.4 g/dL — ABNORMAL LOW (ref 3.9–5.0)
Alkaline Phosphatase: 210 IU/L — ABNORMAL HIGH (ref 48–121)
BUN/Creatinine Ratio: 12 (ref 9–23)
BUN: 7 mg/dL (ref 6–20)
Bilirubin Total: 0.6 mg/dL (ref 0.0–1.2)
CO2: 20 mmol/L (ref 20–29)
Calcium: 9.1 mg/dL (ref 8.7–10.2)
Chloride: 103 mmol/L (ref 96–106)
Creatinine, Ser: 0.59 mg/dL (ref 0.57–1.00)
GFR calc Af Amer: 148 mL/min/{1.73_m2} (ref >59)
GFR calc non Af Amer: 129 mL/min/{1.73_m2} (ref >59)
Globulin, Total: 2.8 g/dL (ref 1.5–4.5)
Glucose: 77 mg/dL (ref 65–99)
Potassium: 4 mmol/L (ref 3.5–5.2)
Sodium: 137 mmol/L (ref 134–144)
Total Protein: 6.2 g/dL (ref 6.0–8.5)

## 2020-05-31 LAB — BILE ACIDS, TOTAL: Bile Acids Total: 19.7 umol/L (ref 0.0–10.0)

## 2020-06-01 ENCOUNTER — Telehealth: Payer: Self-pay

## 2020-06-01 ENCOUNTER — Encounter: Payer: Self-pay | Admitting: Obstetrics and Gynecology

## 2020-06-01 DIAGNOSIS — O26619 Liver and biliary tract disorders in pregnancy, unspecified trimester: Secondary | ICD-10-CM | POA: Insufficient documentation

## 2020-06-01 DIAGNOSIS — K831 Obstruction of bile duct: Secondary | ICD-10-CM | POA: Insufficient documentation

## 2020-06-01 MED ORDER — URSODIOL 500 MG PO TABS: 500.0000 mg | ORAL_TABLET | Freq: Two times a day (BID) | ORAL | 3 refills | Status: DC

## 2020-06-01 NOTE — Addendum Note (Signed)
Addended by: Leroy Libman on: 06/01/2020 08:18 AM   Modules accepted: Orders

## 2020-06-01 NOTE — Telephone Encounter (Signed)
-----   Message from Conan Bowens, MD sent at 06/01/2020  8:18 AM EDT ----- Please call patient and inform her of cholestasis of pregnancy diagnosis, needs to start weekly testing, schedule BPP weekly starting ASAP, I have put orders in. Also to start actigall, sent to pharmacy. Delivery at 37 weeks.

## 2020-06-01 NOTE — Telephone Encounter (Signed)
Pt notified with language line on the line .  Pt advised to keep scheduled appt for U/S and BPP was added per scheduling to appt   this week on Friday . Pt agreeable and also made aware to start Rx.

## 2020-06-05 ENCOUNTER — Ambulatory Visit: Payer: Medicaid Other | Attending: Obstetrics and Gynecology

## 2020-06-05 ENCOUNTER — Other Ambulatory Visit: Payer: Self-pay | Admitting: *Deleted

## 2020-06-05 ENCOUNTER — Other Ambulatory Visit: Payer: Self-pay

## 2020-06-05 ENCOUNTER — Ambulatory Visit: Payer: Medicaid Other | Admitting: *Deleted

## 2020-06-05 VITALS — BP 90/63 | HR 87

## 2020-06-05 DIAGNOSIS — Z362 Encounter for other antenatal screening follow-up: Secondary | ICD-10-CM

## 2020-06-05 DIAGNOSIS — Z3480 Encounter for supervision of other normal pregnancy, unspecified trimester: Secondary | ICD-10-CM | POA: Diagnosis not present

## 2020-06-05 DIAGNOSIS — Z148 Genetic carrier of other disease: Secondary | ICD-10-CM

## 2020-06-05 DIAGNOSIS — Z3689 Encounter for other specified antenatal screening: Secondary | ICD-10-CM | POA: Insufficient documentation

## 2020-06-05 DIAGNOSIS — O26613 Liver and biliary tract disorders in pregnancy, third trimester: Secondary | ICD-10-CM | POA: Diagnosis not present

## 2020-06-05 DIAGNOSIS — K831 Obstruction of bile duct: Secondary | ICD-10-CM

## 2020-06-05 DIAGNOSIS — Z3A33 33 weeks gestation of pregnancy: Secondary | ICD-10-CM

## 2020-06-05 DIAGNOSIS — O09213 Supervision of pregnancy with history of pre-term labor, third trimester: Secondary | ICD-10-CM

## 2020-06-05 DIAGNOSIS — O26643 Intrahepatic cholestasis of pregnancy, third trimester: Secondary | ICD-10-CM

## 2020-06-05 DIAGNOSIS — O365930 Maternal care for other known or suspected poor fetal growth, third trimester, not applicable or unspecified: Secondary | ICD-10-CM | POA: Diagnosis not present

## 2020-06-12 ENCOUNTER — Other Ambulatory Visit: Payer: Self-pay

## 2020-06-12 ENCOUNTER — Ambulatory Visit: Payer: Medicaid Other | Admitting: *Deleted

## 2020-06-12 ENCOUNTER — Ambulatory Visit: Payer: Medicaid Other | Attending: Obstetrics and Gynecology

## 2020-06-12 ENCOUNTER — Ambulatory Visit (INDEPENDENT_AMBULATORY_CARE_PROVIDER_SITE_OTHER): Payer: Medicaid Other | Admitting: Obstetrics and Gynecology

## 2020-06-12 VITALS — BP 97/68 | HR 90 | Wt 147.0 lb

## 2020-06-12 DIAGNOSIS — O26613 Liver and biliary tract disorders in pregnancy, third trimester: Secondary | ICD-10-CM

## 2020-06-12 DIAGNOSIS — Z789 Other specified health status: Secondary | ICD-10-CM

## 2020-06-12 DIAGNOSIS — K831 Obstruction of bile duct: Secondary | ICD-10-CM

## 2020-06-12 DIAGNOSIS — O365930 Maternal care for other known or suspected poor fetal growth, third trimester, not applicable or unspecified: Secondary | ICD-10-CM | POA: Diagnosis not present

## 2020-06-12 DIAGNOSIS — Z3480 Encounter for supervision of other normal pregnancy, unspecified trimester: Secondary | ICD-10-CM

## 2020-06-12 DIAGNOSIS — Z3A34 34 weeks gestation of pregnancy: Secondary | ICD-10-CM

## 2020-06-12 DIAGNOSIS — Z603 Acculturation difficulty: Secondary | ICD-10-CM

## 2020-06-12 DIAGNOSIS — O09213 Supervision of pregnancy with history of pre-term labor, third trimester: Secondary | ICD-10-CM

## 2020-06-12 DIAGNOSIS — Z362 Encounter for other antenatal screening follow-up: Secondary | ICD-10-CM

## 2020-06-12 DIAGNOSIS — Z148 Genetic carrier of other disease: Secondary | ICD-10-CM | POA: Diagnosis not present

## 2020-06-12 DIAGNOSIS — D563 Thalassemia minor: Secondary | ICD-10-CM

## 2020-06-12 NOTE — Progress Notes (Signed)
Patient reports fetal movement, denies pain. 

## 2020-06-12 NOTE — Progress Notes (Signed)
   PRENATAL VISIT NOTE  Subjective:  [name] is a 24 y.o. C1K4818 at [redacted]w[redacted]d being seen today for ongoing prenatal care.  She is currently monitored for the following issues for this high-risk pregnancy and has Language barrier; Supervision of other normal pregnancy, antepartum; UTI in pregnancy, antepartum, first trimester; Alpha thalassemia silent carrier; IUGR (intrauterine growth restriction) affecting care of mother; and Cholestasis during pregnancy on their problem list.  Patient doing well with no acute concerns today. She reports no complaints.  Contractions: Not present. Vag. Bleeding: None.  Movement: Present. Denies leaking of fluid.  Pt patient her itching has gotten better.  She states a doctor called and discussed her getting delivered at 37 weeks.  The following portions of the patient's history were reviewed and updated as appropriate: allergies, current medications, past family history, past medical history, past social history, past surgical history and problem list. Problem list updated.  Objective:   Vitals:   06/12/20 1048  BP: 97/68  Pulse: 90  Weight: 147 lb (66.7 kg)    Fetal Status: Fetal Heart Rate (bpm): 145   Movement: Present     General:  Alert, oriented and cooperative. Patient is in no acute distress.  Skin: Skin is warm and dry. No rash noted.   Cardiovascular: Normal heart rate noted  Respiratory: Normal respiratory effort, no problems with respiration noted  Abdomen: Soft, gravid, appropriate for gestational age.  Pain/Pressure: Absent     Pelvic: Cervical exam deferred        Extremities: Normal range of motion.  Edema: None  Mental Status:  Normal mood and affect. Normal behavior. Normal judgment and thought content.   Assessment and Plan:  Pregnancy: G3P1102 at [redacted]w[redacted]d  1. Language barrier [name] interpreter utilized  2. Cholestasis during pregnancy in third trimester Pt states improvement in itching with her medication, ? Delivery at 37 weeks per  guidelines, will need confirmation with MFM  3. Supervision of other normal pregnancy, antepartum Tentatively schedule IOL at next visit for 37 weeks  4. Poor fetal growth affecting management of mother in third trimester, single or unspecified fetus Weekly testing and BPPm pt has appointment today  5. Alpha thalassemia silent carrier   Preterm labor symptoms and general obstetric precautions including but not limited to vaginal bleeding, contractions, leaking of fluid and fetal movement were reviewed in detail with the patient.  Please refer to After Visit Summary for other counseling recommendations.   Return in about 1 week (around 06/19/2020) for [facility], in person, 36 weeks swabs.   Mariel Aloe, MD

## 2020-06-18 ENCOUNTER — Other Ambulatory Visit: Payer: Self-pay

## 2020-06-18 ENCOUNTER — Other Ambulatory Visit (HOSPITAL_COMMUNITY)
Admission: RE | Admit: 2020-06-18 | Discharge: 2020-06-18 | Disposition: A | Discharge status: Home / Self Care | Payer: Medicaid Other | Source: Ambulatory Visit | Attending: Obstetrics | Admitting: Obstetrics

## 2020-06-18 ENCOUNTER — Encounter: Payer: Self-pay | Admitting: Obstetrics

## 2020-06-18 ENCOUNTER — Ambulatory Visit (INDEPENDENT_AMBULATORY_CARE_PROVIDER_SITE_OTHER): Payer: Medicaid Other | Admitting: Obstetrics

## 2020-06-18 VITALS — BP 94/65 | HR 107 | Wt 148.2 lb

## 2020-06-18 DIAGNOSIS — O0990 Supervision of high risk pregnancy, unspecified, unspecified trimester: Secondary | ICD-10-CM

## 2020-06-18 DIAGNOSIS — O26613 Liver and biliary tract disorders in pregnancy, third trimester: Secondary | ICD-10-CM

## 2020-06-18 DIAGNOSIS — Z789 Other specified health status: Secondary | ICD-10-CM

## 2020-06-18 DIAGNOSIS — K831 Obstruction of bile duct: Secondary | ICD-10-CM

## 2020-06-18 DIAGNOSIS — Z3A35 35 weeks gestation of pregnancy: Secondary | ICD-10-CM

## 2020-06-18 DIAGNOSIS — O0993 Supervision of high risk pregnancy, unspecified, third trimester: Secondary | ICD-10-CM

## 2020-06-18 NOTE — Progress Notes (Signed)
Subjective:  [name] is a 24 y.o. A4T3646 at [redacted]w[redacted]d being seen today for ongoing prenatal care.  She is currently monitored for the following issues for this high-risk pregnancy and has Language barrier; Supervision of other normal pregnancy, antepartum; UTI in pregnancy, antepartum, first trimester; Alpha thalassemia silent carrier; IUGR (intrauterine growth restriction) affecting care of mother; and Cholestasis during pregnancy on their problem list.  Patient reports no complaints.  Contractions: Irritability. Vag. Bleeding: None.  Movement: Present. Denies leaking of fluid.   The following portions of the patient's history were reviewed and updated as appropriate: allergies, current medications, past family history, past medical history, past social history, past surgical history and problem list. Problem list updated.  Objective:   Vitals:   06/18/20 0913  BP: 94/65  Pulse: (!) 107  Weight: 148 lb 3.2 oz (67.2 kg)    Fetal Status:     Movement: Present     General:  Alert, oriented and cooperative. Patient is in no acute distress.  Skin: Skin is warm and dry. No rash noted.   Cardiovascular: Normal heart rate noted  Respiratory: Normal respiratory effort, no problems with respiration noted  Abdomen: Soft, gravid, appropriate for gestational age. Pain/Pressure: Absent     Pelvic:  Cervical exam deferred        Extremities: Normal range of motion.  Edema: None  Mental Status: Normal mood and affect. Normal behavior. Normal judgment and thought content.   Urinalysis:      Assessment and Plan:  Pregnancy: G3P1102 at [redacted]w[redacted]d  1. Supervision of high risk pregnancy, antepartum Rx: - Cervicovaginal ancillary only( [place]) - Strep Gp B NAA  2. Language barrier  3. Cholestasis during pregnancy in third trimester - weekly BPP with U/A Dopplers - delivery at ~ 37 weeks, per MFM  Preterm labor symptoms and general obstetric precautions including but not limited to vaginal bleeding,  contractions, leaking of fluid and fetal movement were reviewed in detail with the patient. Please refer to After Visit Summary for other counseling recommendations.   Return in about 1 week (around 06/25/2020) for [facility].   Brock Bad, MD  06/18/20

## 2020-06-18 NOTE — Progress Notes (Signed)
Pt is here for ROB, [redacted]w[redacted]d.

## 2020-06-19 ENCOUNTER — Ambulatory Visit: Payer: Medicaid Other | Admitting: *Deleted

## 2020-06-19 ENCOUNTER — Ambulatory Visit: Payer: Medicaid Other | Attending: Obstetrics

## 2020-06-19 VITALS — BP 93/60 | HR 89

## 2020-06-19 DIAGNOSIS — O09213 Supervision of pregnancy with history of pre-term labor, third trimester: Secondary | ICD-10-CM

## 2020-06-19 DIAGNOSIS — Z362 Encounter for other antenatal screening follow-up: Secondary | ICD-10-CM | POA: Diagnosis not present

## 2020-06-19 DIAGNOSIS — Z3480 Encounter for supervision of other normal pregnancy, unspecified trimester: Secondary | ICD-10-CM | POA: Insufficient documentation

## 2020-06-19 DIAGNOSIS — O365930 Maternal care for other known or suspected poor fetal growth, third trimester, not applicable or unspecified: Secondary | ICD-10-CM

## 2020-06-19 DIAGNOSIS — Z148 Genetic carrier of other disease: Secondary | ICD-10-CM | POA: Diagnosis not present

## 2020-06-19 DIAGNOSIS — O26613 Liver and biliary tract disorders in pregnancy, third trimester: Secondary | ICD-10-CM | POA: Diagnosis not present

## 2020-06-19 DIAGNOSIS — Z3A35 35 weeks gestation of pregnancy: Secondary | ICD-10-CM

## 2020-06-19 DIAGNOSIS — K831 Obstruction of bile duct: Secondary | ICD-10-CM

## 2020-06-19 LAB — CERVICOVAGINAL ANCILLARY ONLY
Chlamydia: NEGATIVE
Comment: NEGATIVE
Comment: NORMAL
Neisseria Gonorrhea: NEGATIVE

## 2020-06-20 LAB — STREP GP B NAA: Strep Gp B NAA: POSITIVE — AB

## 2020-06-21 DIAGNOSIS — Z419 Encounter for procedure for purposes other than remedying health state, unspecified: Secondary | ICD-10-CM | POA: Diagnosis not present

## 2020-06-23 ENCOUNTER — Encounter: Payer: Self-pay | Admitting: Obstetrics

## 2020-06-23 ENCOUNTER — Ambulatory Visit (INDEPENDENT_AMBULATORY_CARE_PROVIDER_SITE_OTHER): Payer: Medicaid Other | Admitting: Obstetrics

## 2020-06-23 ENCOUNTER — Other Ambulatory Visit (HOSPITAL_COMMUNITY)
Admission: RE | Admit: 2020-06-23 | Discharge: 2020-06-23 | Disposition: A | Discharge status: Home / Self Care | Payer: Medicaid Other | Source: Ambulatory Visit | Attending: Obstetrics | Admitting: Obstetrics

## 2020-06-23 ENCOUNTER — Other Ambulatory Visit: Payer: Self-pay

## 2020-06-23 VITALS — BP 101/69 | HR 94 | Wt 149.8 lb

## 2020-06-23 DIAGNOSIS — K831 Obstruction of bile duct: Secondary | ICD-10-CM

## 2020-06-23 DIAGNOSIS — Z789 Other specified health status: Secondary | ICD-10-CM

## 2020-06-23 DIAGNOSIS — O0990 Supervision of high risk pregnancy, unspecified, unspecified trimester: Secondary | ICD-10-CM | POA: Diagnosis not present

## 2020-06-23 DIAGNOSIS — O26613 Liver and biliary tract disorders in pregnancy, third trimester: Secondary | ICD-10-CM

## 2020-06-23 DIAGNOSIS — Z3A36 36 weeks gestation of pregnancy: Secondary | ICD-10-CM

## 2020-06-23 NOTE — Progress Notes (Signed)
Patient presents for ROB and GBS. Patient has no concerns today. 

## 2020-06-23 NOTE — Progress Notes (Signed)
Subjective:  [name] is a 24 y.o. C4U8891 at [redacted]w[redacted]d being seen today for ongoing prenatal care.  She is currently monitored for the following issues for this high-risk pregnancy and has Language barrier; Supervision of other normal pregnancy, antepartum; UTI in pregnancy, antepartum, first trimester; Alpha thalassemia silent carrier; IUGR (intrauterine growth restriction) affecting care of mother; and Cholestasis during pregnancy on their problem list.  Patient reports no complaints.  Contractions: Irritability. Vag. Bleeding: None.  Movement: Present. Denies leaking of fluid.   The following portions of the patient's history were reviewed and updated as appropriate: allergies, current medications, past family history, past medical history, past social history, past surgical history and problem list. Problem list updated.  Objective:   Vitals:   06/23/20 0852  BP: 101/69  Pulse: 94  Weight: 149 lb 12.8 oz (67.9 kg)    Fetal Status:     Movement: Present     General:  Alert, oriented and cooperative. Patient is in no acute distress.  Skin: Skin is warm and dry. No rash noted.   Cardiovascular: Normal heart rate noted  Respiratory: Normal respiratory effort, no problems with respiration noted  Abdomen: Soft, gravid, appropriate for gestational age. Pain/Pressure: Absent     Pelvic:  Cervical exam deferred        Extremities: Normal range of motion.  Edema: None  Mental Status: Normal mood and affect. Normal behavior. Normal judgment and thought content.   Urinalysis:      Assessment and Plan:  Pregnancy: G3P1102 at [redacted]w[redacted]d  1. Supervision of high risk pregnancy, antepartum Rx: - Strep Gp B NAA - Cervicovaginal ancillary only( [place])  2. Language barrier - interpreter present  3. Cholestasis during pregnancy in third trimester - weekly BPP with U/A Dopplers - IOL at 37 weeks, per MFM   Term labor symptoms and general obstetric precautions including but not limited to  vaginal bleeding, contractions, leaking of fluid and fetal movement were reviewed in detail with the patient. Please refer to After Visit Summary for other counseling recommendations.   Return in about 4 weeks (around 07/21/2020) for postpartum visit.   Brock Bad, MD  06/23/20

## 2020-06-24 ENCOUNTER — Telehealth (HOSPITAL_COMMUNITY): Payer: Self-pay | Admitting: *Deleted

## 2020-06-24 LAB — CERVICOVAGINAL ANCILLARY ONLY
Chlamydia: NEGATIVE
Comment: NEGATIVE
Comment: NORMAL
Neisseria Gonorrhea: NEGATIVE

## 2020-06-24 NOTE — Telephone Encounter (Signed)
[phone] interpreter number  Preadmission screen

## 2020-06-25 ENCOUNTER — Telehealth (HOSPITAL_COMMUNITY): Payer: Self-pay | Admitting: *Deleted

## 2020-06-25 ENCOUNTER — Other Ambulatory Visit: Payer: Self-pay | Admitting: Obstetrics

## 2020-06-25 ENCOUNTER — Encounter: Payer: Medicaid Other | Admitting: Obstetrics & Gynecology

## 2020-06-25 LAB — STREP GP B NAA: Strep Gp B NAA: POSITIVE — AB

## 2020-06-25 NOTE — Telephone Encounter (Signed)
Preadmission screen  

## 2020-06-25 NOTE — Telephone Encounter (Signed)
Preadmission screen Interpreter [phone]

## 2020-06-26 ENCOUNTER — Encounter: Payer: Self-pay | Admitting: *Deleted

## 2020-06-26 ENCOUNTER — Other Ambulatory Visit: Payer: Self-pay

## 2020-06-26 ENCOUNTER — Ambulatory Visit: Payer: Medicaid Other | Admitting: *Deleted

## 2020-06-26 ENCOUNTER — Ambulatory Visit: Payer: Medicaid Other | Attending: Obstetrics

## 2020-06-26 DIAGNOSIS — Z3A36 36 weeks gestation of pregnancy: Secondary | ICD-10-CM

## 2020-06-26 DIAGNOSIS — Z3480 Encounter for supervision of other normal pregnancy, unspecified trimester: Secondary | ICD-10-CM | POA: Insufficient documentation

## 2020-06-26 DIAGNOSIS — Z362 Encounter for other antenatal screening follow-up: Secondary | ICD-10-CM

## 2020-06-26 DIAGNOSIS — O09213 Supervision of pregnancy with history of pre-term labor, third trimester: Secondary | ICD-10-CM | POA: Diagnosis not present

## 2020-06-26 DIAGNOSIS — O26613 Liver and biliary tract disorders in pregnancy, third trimester: Secondary | ICD-10-CM | POA: Diagnosis not present

## 2020-06-26 DIAGNOSIS — K831 Obstruction of bile duct: Secondary | ICD-10-CM | POA: Insufficient documentation

## 2020-06-26 DIAGNOSIS — Z148 Genetic carrier of other disease: Secondary | ICD-10-CM

## 2020-06-27 ENCOUNTER — Other Ambulatory Visit (HOSPITAL_COMMUNITY)
Admission: RE | Admit: 2020-06-27 | Discharge: 2020-06-27 | Disposition: A | Discharge status: Home / Self Care | Payer: Medicaid Other | Source: Ambulatory Visit | Attending: Family Medicine | Admitting: Family Medicine

## 2020-06-27 DIAGNOSIS — Z01812 Encounter for preprocedural laboratory examination: Secondary | ICD-10-CM | POA: Insufficient documentation

## 2020-06-27 DIAGNOSIS — Z20822 Contact with and (suspected) exposure to covid-19: Secondary | ICD-10-CM | POA: Insufficient documentation

## 2020-06-27 LAB — SARS CORONAVIRUS 2 (TAT 6-24 HRS): SARS Coronavirus 2: NEGATIVE

## 2020-06-28 ENCOUNTER — Other Ambulatory Visit: Payer: Self-pay | Admitting: Advanced Practice Midwife

## 2020-06-28 ENCOUNTER — Other Ambulatory Visit: Payer: Self-pay | Admitting: Family Medicine

## 2020-06-29 ENCOUNTER — Inpatient Hospital Stay (HOSPITAL_COMMUNITY): Payer: Medicaid Other | Admitting: Anesthesiology

## 2020-06-29 ENCOUNTER — Encounter (HOSPITAL_COMMUNITY): Payer: Self-pay | Admitting: Obstetrics & Gynecology

## 2020-06-29 ENCOUNTER — Other Ambulatory Visit: Payer: Self-pay

## 2020-06-29 ENCOUNTER — Inpatient Hospital Stay (HOSPITAL_COMMUNITY)
Admission: AD | Admit: 2020-06-29 | Discharge: 2020-07-01 | DRG: 805 | Disposition: A | Discharge status: Home / Self Care | Payer: Medicaid Other | Attending: Obstetrics & Gynecology | Admitting: Obstetrics & Gynecology

## 2020-06-29 ENCOUNTER — Inpatient Hospital Stay (HOSPITAL_COMMUNITY): Payer: Medicaid Other

## 2020-06-29 DIAGNOSIS — K831 Obstruction of bile duct: Secondary | ICD-10-CM | POA: Diagnosis not present

## 2020-06-29 DIAGNOSIS — D649 Anemia, unspecified: Secondary | ICD-10-CM | POA: Diagnosis not present

## 2020-06-29 DIAGNOSIS — O2662 Liver and biliary tract disorders in childbirth: Principal | ICD-10-CM | POA: Diagnosis present

## 2020-06-29 DIAGNOSIS — O9902 Anemia complicating childbirth: Secondary | ICD-10-CM | POA: Diagnosis not present

## 2020-06-29 DIAGNOSIS — D563 Thalassemia minor: Secondary | ICD-10-CM | POA: Diagnosis not present

## 2020-06-29 DIAGNOSIS — Z3480 Encounter for supervision of other normal pregnancy, unspecified trimester: Secondary | ICD-10-CM

## 2020-06-29 DIAGNOSIS — Z603 Acculturation difficulty: Secondary | ICD-10-CM | POA: Diagnosis present

## 2020-06-29 DIAGNOSIS — O99824 Streptococcus B carrier state complicating childbirth: Secondary | ICD-10-CM | POA: Diagnosis present

## 2020-06-29 DIAGNOSIS — O26619 Liver and biliary tract disorders in pregnancy, unspecified trimester: Secondary | ICD-10-CM | POA: Diagnosis present

## 2020-06-29 DIAGNOSIS — Z3490 Encounter for supervision of normal pregnancy, unspecified, unspecified trimester: Secondary | ICD-10-CM

## 2020-06-29 DIAGNOSIS — Z789 Other specified health status: Secondary | ICD-10-CM | POA: Diagnosis present

## 2020-06-29 DIAGNOSIS — Z3A37 37 weeks gestation of pregnancy: Secondary | ICD-10-CM

## 2020-06-29 DIAGNOSIS — O26649 Intrahepatic cholestasis of pregnancy, unspecified trimester: Secondary | ICD-10-CM | POA: Diagnosis present

## 2020-06-29 LAB — CBC
HCT: 31.9 % — ABNORMAL LOW (ref 36.0–46.0)
Hemoglobin: 9.3 g/dL — ABNORMAL LOW (ref 12.0–15.0)
MCH: 22.1 pg — ABNORMAL LOW (ref 26.0–34.0)
MCHC: 29.2 g/dL — ABNORMAL LOW (ref 30.0–36.0)
MCV: 75.8 fL — ABNORMAL LOW (ref 80.0–100.0)
Platelets: 362 10*3/uL (ref 150–400)
RBC: 4.21 MIL/uL (ref 3.87–5.11)
RDW: 17.4 % — ABNORMAL HIGH (ref 11.5–15.5)
WBC: 11 10*3/uL — ABNORMAL HIGH (ref 4.0–10.5)
nRBC: 0 % (ref 0.0–0.2)

## 2020-06-29 LAB — TYPE AND SCREEN
ABO/RH(D): AB POS
Antibody Screen: NEGATIVE

## 2020-06-29 LAB — RPR: RPR Ser Ql: NONREACTIVE

## 2020-06-29 MED ORDER — MISOPROSTOL 25 MCG QUARTER TABLET
25.0000 ug | ORAL_TABLET | ORAL | Status: DC | PRN
  Administered 2020-06-29 (×2): 25 ug via VAGINAL
  Filled 2020-06-29 (×3): qty 1

## 2020-06-29 MED ORDER — PHENYLEPHRINE 40 MCG/ML (10ML) SYRINGE FOR IV PUSH (FOR BLOOD PRESSURE SUPPORT): 80.0000 ug | PREFILLED_SYRINGE | INTRAVENOUS | Status: DC | PRN

## 2020-06-29 MED ORDER — SIMETHICONE 80 MG PO CHEW: 80.0000 mg | CHEWABLE_TABLET | ORAL | Status: DC | PRN

## 2020-06-29 MED ORDER — PRENATAL MULTIVITAMIN CH
1.0000 | ORAL_TABLET | Freq: Every day | ORAL | Status: DC
  Administered 2020-06-30 – 2020-07-01 (×2): 1 via ORAL
  Filled 2020-06-29 (×2): qty 1

## 2020-06-29 MED ORDER — MISOPROSTOL 50MCG HALF TABLET
ORAL_TABLET | ORAL | Status: AC
  Filled 2020-06-29: qty 1

## 2020-06-29 MED ORDER — LACTATED RINGERS IV SOLN: INTRAVENOUS | Status: DC

## 2020-06-29 MED ORDER — ACETAMINOPHEN 325 MG PO TABS
650.0000 mg | ORAL_TABLET | ORAL | Status: DC | PRN
  Administered 2020-07-01: 650 mg via ORAL
  Filled 2020-06-29: qty 2

## 2020-06-29 MED ORDER — OXYTOCIN BOLUS FROM INFUSION
333.0000 mL | Freq: Once | INTRAVENOUS | Status: AC
  Administered 2020-06-29: 333 mL via INTRAVENOUS

## 2020-06-29 MED ORDER — LACTATED RINGERS IV SOLN
500.0000 mL | Freq: Once | INTRAVENOUS | Status: AC
  Administered 2020-06-29: 500 mL via INTRAVENOUS

## 2020-06-29 MED ORDER — PHENYLEPHRINE 40 MCG/ML (10ML) SYRINGE FOR IV PUSH (FOR BLOOD PRESSURE SUPPORT)
80.0000 ug | PREFILLED_SYRINGE | INTRAVENOUS | Status: DC | PRN
  Filled 2020-06-29: qty 10

## 2020-06-29 MED ORDER — IBUPROFEN 600 MG PO TABS
600.0000 mg | ORAL_TABLET | Freq: Four times a day (QID) | ORAL | Status: DC
  Administered 2020-06-30 – 2020-07-01 (×7): 600 mg via ORAL
  Filled 2020-06-29 (×7): qty 1

## 2020-06-29 MED ORDER — MAGNESIUM HYDROXIDE 400 MG/5ML PO SUSP: 30.0000 mL | ORAL | Status: DC | PRN

## 2020-06-29 MED ORDER — OXYCODONE HCL 5 MG PO TABS: 5.0000 mg | ORAL_TABLET | ORAL | Status: DC | PRN

## 2020-06-29 MED ORDER — EPHEDRINE 5 MG/ML INJ: 10.0000 mg | INTRAVENOUS | Status: DC | PRN

## 2020-06-29 MED ORDER — OXYCODONE-ACETAMINOPHEN 5-325 MG PO TABS: 2.0000 | ORAL_TABLET | ORAL | Status: DC | PRN

## 2020-06-29 MED ORDER — ONDANSETRON HCL 4 MG PO TABS: 4.0000 mg | ORAL_TABLET | ORAL | Status: DC | PRN

## 2020-06-29 MED ORDER — ONDANSETRON HCL 4 MG/2ML IJ SOLN: 4.0000 mg | INTRAMUSCULAR | Status: DC | PRN

## 2020-06-29 MED ORDER — ONDANSETRON HCL 4 MG/2ML IJ SOLN: 4.0000 mg | Freq: Four times a day (QID) | INTRAMUSCULAR | Status: DC | PRN

## 2020-06-29 MED ORDER — LACTATED RINGERS AMNIOINFUSION: INTRAVENOUS | Status: DC

## 2020-06-29 MED ORDER — TETANUS-DIPHTH-ACELL PERTUSSIS 5-2.5-18.5 LF-MCG/0.5 IM SUSP: 0.5000 mL | Freq: Once | INTRAMUSCULAR | Status: DC

## 2020-06-29 MED ORDER — MISOPROSTOL 50MCG HALF TABLET
50.0000 ug | ORAL_TABLET | ORAL | Status: DC | PRN
  Administered 2020-06-29: 50 ug via BUCCAL

## 2020-06-29 MED ORDER — OXYCODONE HCL 5 MG PO TABS: 10.0000 mg | ORAL_TABLET | ORAL | Status: DC | PRN

## 2020-06-29 MED ORDER — BENZOCAINE-MENTHOL 20-0.5 % EX AERO: 1.0000 "application " | INHALATION_SPRAY | CUTANEOUS | Status: DC | PRN

## 2020-06-29 MED ORDER — FENTANYL CITRATE (PF) 100 MCG/2ML IJ SOLN
100.0000 ug | INTRAMUSCULAR | Status: DC | PRN
  Administered 2020-06-29: 100 ug via INTRAVENOUS
  Filled 2020-06-29: qty 2

## 2020-06-29 MED ORDER — LIDOCAINE HCL (PF) 1 % IJ SOLN
INTRAMUSCULAR | Status: DC | PRN
  Administered 2020-06-29: 10 mL via EPIDURAL

## 2020-06-29 MED ORDER — SOD CITRATE-CITRIC ACID 500-334 MG/5ML PO SOLN: 30.0000 mL | ORAL | Status: DC | PRN

## 2020-06-29 MED ORDER — ZOLPIDEM TARTRATE 5 MG PO TABS: 5.0000 mg | ORAL_TABLET | Freq: Every evening | ORAL | Status: DC | PRN

## 2020-06-29 MED ORDER — LIDOCAINE HCL (PF) 1 % IJ SOLN: 30.0000 mL | INTRAMUSCULAR | Status: DC | PRN

## 2020-06-29 MED ORDER — OXYTOCIN-SODIUM CHLORIDE 30-0.9 UT/500ML-% IV SOLN
2.5000 [IU]/h | INTRAVENOUS | Status: DC
  Filled 2020-06-29: qty 500

## 2020-06-29 MED ORDER — DIBUCAINE (PERIANAL) 1 % EX OINT: 1.0000 "application " | TOPICAL_OINTMENT | CUTANEOUS | Status: DC | PRN

## 2020-06-29 MED ORDER — WITCH HAZEL-GLYCERIN EX PADS: 1.0000 "application " | MEDICATED_PAD | CUTANEOUS | Status: DC | PRN

## 2020-06-29 MED ORDER — SODIUM CHLORIDE 0.9 % IV SOLN
5.0000 10*6.[IU] | Freq: Once | INTRAVENOUS | Status: AC
  Administered 2020-06-29: 5 10*6.[IU] via INTRAVENOUS
  Filled 2020-06-29: qty 5

## 2020-06-29 MED ORDER — LACTATED RINGERS IV SOLN: 500.0000 mL | INTRAVENOUS | Status: DC | PRN

## 2020-06-29 MED ORDER — SODIUM CHLORIDE (PF) 0.9 % IJ SOLN
INTRAMUSCULAR | Status: DC | PRN
  Administered 2020-06-29: 12 mL/h via EPIDURAL

## 2020-06-29 MED ORDER — OXYCODONE-ACETAMINOPHEN 5-325 MG PO TABS: 1.0000 | ORAL_TABLET | ORAL | Status: DC | PRN

## 2020-06-29 MED ORDER — TERBUTALINE SULFATE 1 MG/ML IJ SOLN: 0.2500 mg | Freq: Once | INTRAMUSCULAR | Status: DC | PRN

## 2020-06-29 MED ORDER — COCONUT OIL OIL: 1.0000 "application " | TOPICAL_OIL | Status: DC | PRN

## 2020-06-29 MED ORDER — SENNOSIDES-DOCUSATE SODIUM 8.6-50 MG PO TABS
2.0000 | ORAL_TABLET | ORAL | Status: DC
  Administered 2020-06-30 (×2): 2 via ORAL
  Filled 2020-06-29 (×2): qty 2

## 2020-06-29 MED ORDER — PENICILLIN G POT IN DEXTROSE 60000 UNIT/ML IV SOLN
3.0000 10*6.[IU] | INTRAVENOUS | Status: DC
  Administered 2020-06-29 (×3): 3 10*6.[IU] via INTRAVENOUS
  Filled 2020-06-29 (×3): qty 50

## 2020-06-29 MED ORDER — DIPHENHYDRAMINE HCL 50 MG/ML IJ SOLN: 12.5000 mg | INTRAMUSCULAR | Status: DC | PRN

## 2020-06-29 MED ORDER — DIPHENHYDRAMINE HCL 25 MG PO CAPS: 25.0000 mg | ORAL_CAPSULE | Freq: Four times a day (QID) | ORAL | Status: DC | PRN

## 2020-06-29 MED ORDER — FENTANYL-BUPIVACAINE-NACL 0.5-0.125-0.9 MG/250ML-% EP SOLN
12.0000 mL/h | EPIDURAL | Status: DC | PRN
  Filled 2020-06-29: qty 250

## 2020-06-29 MED ORDER — ACETAMINOPHEN 325 MG PO TABS: 650.0000 mg | ORAL_TABLET | ORAL | Status: DC | PRN

## 2020-06-29 NOTE — Progress Notes (Signed)
Labor Progress Note [name] is a 24 y.o. [id number] at [redacted]w[redacted]d presented for IOL due to cholestasis.  S: Doing well overall. Starting to feel occasional contractions.  O:  BP 109/73   Pulse 91   Temp 97.7 F (36.5 C) (Oral)   Resp 17   Ht 4\' 11"  (1.499 m)   Wt 68.3 kg   LMP 10/03/2019 (Exact Date)   BMI 30.42 kg/m  EFM: 120/mod variability/+ accels, no decels  CVE: Dilation: 1.5 Effacement (%): 30 Station: -2 Presentation: Vertex Exam by:: Dr. [email or web address]   A&P: 24 y.o. 22 [redacted]w[redacted]d who presented for IOL due to cholestasis. #Labor: Progressing well. S/p vaginal Cytotec x 1 at 0140, gave 2nd dose of vaginal Cytotec at 0559. Now dilated to 1.5cm, will consider FB placement on next check. #Pain: Per patient #FWB: Cat I FHT #GBS positive, receiving Penicillin. #Cholestasis: Bile acids elevated to 19.7 on 05/31/20   08/01/20, MD 6:12 AM

## 2020-06-29 NOTE — H&P (Signed)
[name] is a 24 y.o. female [id number] at 37.0wks by LMP, but later amended to agree with 18wk scan [facility] 07/20/20) presenting for IOL due to cholestasis. Denies H/A, N/V and visual disturbances. Her preg has been followed by the [facility] service and has been remarkable for:  * cholestasis dx at 33wks (BA 19.7) * SGA dx in 2nd trimester, but resolved with EDC amendment to correspond with 18wk scan (LMP lagged by 11days) * language barrier [name]) * silent carrier alpha thal  OB History    Gravida  3   Para  2   Term  1   Preterm  1   AB  0   Living  2     SAB  0   TAB  0   Ectopic  0   Multiple  0   Live Births  2          Past Medical History:  Diagnosis Date   Medical history non-contributory    No pertinent past medical history    Past Surgical History:  Procedure Laterality Date   NO PAST SURGERIES     Family History: family history is not on file. Social History:  reports that she has never smoked. She has never used smokeless tobacco. She reports that she does not drink alcohol and does not use drugs.     Maternal Diabetes: No Genetic Screening: Normal Maternal Ultrasounds/Referrals: Normal (SGA during 2nd-3rd tri until [facility] changed to fit 18wk scan as LMP dating lagged by 11 days) Fetal Ultrasounds or other Referrals:  Referred to Materal Fetal Medicine  Maternal Substance Abuse:  No Significant Maternal Medications:  None Significant Maternal Lab Results:  Group B Strep positive Other Comments:  silent carrier alpha thal  Review of Systems History Dilation: 1 Effacement (%): Thick Station: -2 Exam by:: Philipp Deputy CNM Blood pressure 97/64, pulse 92, temperature 97.9 F (36.6 C), temperature source Oral, resp. rate 16, height 4\' 11"  (1.499 m), weight 68.3 kg, last menstrual period 10/03/2019, currently breastfeeding. Maternal Exam:  Introitus: Normal vulva.   Physical Exam Constitutional:      Appearance: Normal appearance.  HENT:     Nose:  Nose normal.     Mouth/Throat:     Mouth: Mucous membranes are moist.  Cardiovascular:     Rate and Rhythm: Normal rate.  Pulmonary:     Effort: Pulmonary effort is normal.  Abdominal:     Comments: FHR 120-130s, +accels, no decels, Cat 1 Ctx irreg, mild  Genitourinary:    General: Normal vulva.  Musculoskeletal:        General: Normal range of motion.     Cervical back: Normal range of motion.  Skin:    General: Skin is warm and dry.  Neurological:     Mental Status: She is alert.  Psychiatric:        Mood and Affect: Mood normal.        Behavior: Behavior normal.     Prenatal labs: ABO, Rh: --/--/AB POS (08/09 0050) Antibody: NEG (08/09 0050) Rubella: 5.03 (02/05 1052) RPR: Non Reactive (06/04 0911)  HBsAg: Negative (02/05 1052)  HIV: Non Reactive (06/04 0911)  GBS: Positive/-- (08/03 0951)   Assessment/Plan: IUP@37 .0wks Cholestasis Cx unfavorable  -Admit to Labor and Delivery -Plan cx ripening with cytotec and possibly cervical foley to start, then Pit/AROM prn  -PCN for GBS ppx -Anticipate vag del  ** visit conducted with [employer] 11-23-1997 interpreter **  [name] CNM 06/29/2020, 2:21 AM

## 2020-06-29 NOTE — Progress Notes (Signed)
A [name] Interpreter was used for this entire encounter.  [name] is a 24 y.o. [id number] at [redacted]w[redacted]d admitted for IOL for cholestasis.  Subjective: Patient is feeling her contractions, doing well and breathing through them.  Objective: BP 109/73   Pulse 91   Temp 97.7 F (36.5 C) (Oral)   Resp 17   Ht 4\' 11"  (1.499 m)   Wt 68.3 kg   LMP 10/03/2019 (Exact Date)   BMI 30.42 kg/m  No intake/output data recorded.  FHT:  FHR: 125 bpm, variability: moderate,  accelerations:  Present,  decelerations:  Absent UC:   regular, every 2-5 minutes  SVE:   Dilation: 1.5 Effacement (%): 70 Station: -2 Exam by:: Dr [email or web address]   Labs: Lab Results  Component Value Date   WBC 11.0 (H) 06/29/2020   HGB 9.3 (L) 06/29/2020   HCT 31.9 (L) 06/29/2020   MCV 75.8 (L) 06/29/2020   PLT 362 06/29/2020    Assessment / Plan: [name] is a 24 y.o. 22 at [redacted]w[redacted]d admitted for IOL for cholestasis.  #Labor: S/p vaginal Cytotec x 2 at 0145 and 0559. SVE 1.5/70/-2, FB placed at this exam and buccal cytotec x1 given. #Pain: Per patient #FWB: Cat I FHT #GBS positive, receiving Penicillin. #Cholestasis: Bile acids elevated to 19.7 on 05/31/20   08/01/20 DO PGY1, Family Medicine Resident 06/29/2020, 10:23 AM

## 2020-06-29 NOTE — Anesthesia Procedure Notes (Signed)
Epidural Patient location during procedure: OB Start time: 06/29/2020 2:19 PM End time: 06/29/2020 2:33 PM  Staffing Anesthesiologist: Lucretia Kern, MD Performed: anesthesiologist   Preanesthetic Checklist Completed: patient identified, IV checked, risks and benefits discussed, monitors and equipment checked, pre-op evaluation and timeout performed  Epidural Patient position: sitting Prep: DuraPrep Patient monitoring: heart rate, continuous pulse ox and blood pressure Approach: midline Location: L3-L4 Injection technique: LOR air  Needle:  Needle type: Tuohy  Needle gauge: 17 G Needle length: 9 cm Needle insertion depth: 6 cm Catheter type: closed end flexible Catheter size: 19 Gauge Catheter at skin depth: 11 cm Test dose: negative  Assessment Events: blood not aspirated, injection not painful, no injection resistance, no paresthesia and negative IV test  Additional Notes Reason for block:procedure for pain

## 2020-06-29 NOTE — Anesthesia Preprocedure Evaluation (Signed)
Anesthesia Evaluation  Patient identified by MRN, date of birth, ID band Patient awake    Reviewed: Allergy & Precautions, H&P , NPO status , Patient's Chart, lab work & pertinent test results  History of Anesthesia Complications Negative for: history of anesthetic complications  Airway Mallampati: II  TM Distance: >3 FB Neck ROM: full    Dental no notable dental hx.    Pulmonary neg pulmonary ROS,    Pulmonary exam normal        Cardiovascular negative cardio ROS Normal cardiovascular exam Rhythm:regular Rate:Normal     Neuro/Psych negative neurological ROS  negative psych ROS   GI/Hepatic Neg liver ROS, Cholestasis of pregnancy   Endo/Other  negative endocrine ROS  Renal/GU negative Renal ROS  negative genitourinary   Musculoskeletal negative musculoskeletal ROS (+)   Abdominal   Peds  Hematology  (+) Blood dyscrasia, anemia , Alpha thalassemia trait   Anesthesia Other Findings   Reproductive/Obstetrics (+) Pregnancy                             Anesthesia Physical Anesthesia Plan  ASA: II  Anesthesia Plan: Epidural   Post-op Pain Management:    Induction:   PONV Risk Score and Plan:   Airway Management Planned:   Additional Equipment:   Intra-op Plan:   Post-operative Plan:   Informed Consent: I have reviewed the patients History and Physical, chart, labs and discussed the procedure including the risks, benefits and alternatives for the proposed anesthesia with the patient or authorized representative who has indicated his/her understanding and acceptance.       Plan Discussed with:   Anesthesia Plan Comments:         Anesthesia Quick Evaluation

## 2020-06-29 NOTE — Progress Notes (Signed)
Labor Progress Note [name] is a 24 y.o. [id number] at [redacted]w[redacted]d presented for IOL-cholestasis. S: Doing well without complaints.  O:  BP 114/73   Pulse 83   Temp 98.5 F (36.9 C) (Oral)   Resp 18   Ht 4\' 11"  (1.499 m)   Wt 68.3 kg   LMP 10/03/2019 (Exact Date)   BMI 30.42 kg/m  EFM: 130 bpm/moderate variability/+accels/early decelerations with contractions Toco: q1-4 minutes  CVE: Dilation: 7 Effacement (%): 70 Station: -2 Presentation: Vertex Exam by:: S Nix RN   A&P: 24 y.o. 22 [redacted]w[redacted]d admitted for IOL-cholestasis. #Labor: Progressing well. S/p FB and cytotecx3. Given cervical change and frequent contractions, will continue with expectant management at this time. #Pain: PRN #FWB: cat 2 #GBS positive, PCN, adequate prophylaxis #Language barrier: [redacted]w[redacted]d, use iPad interpreter   [name], MD 1:56 PM

## 2020-06-29 NOTE — Discharge Summary (Signed)
Postpartum Discharge Summary      Patient Name: [name] DOB: 08-26-1996 MRN: [id number]  Date of admission: 06/29/2020 Delivery date:06/29/2020  Delivering provider: Arrie Senate  Date of discharge: 07/01/2020  Admitting diagnosis: Cholestasis during pregnancy in third trimester [O26.613, K83.1] Intrauterine pregnancy: [redacted]w[redacted]d    Secondary diagnosis:  Active Problems:   Language barrier   Supervision of other normal pregnancy, antepartum   Alpha thalassemia silent carrier   Cholestasis during pregnancy   Single liveborn, born in hospital, delivered by vaginal delivery   Encounter for induction of labor  Additional problems: none    Discharge diagnosis: Term Pregnancy Delivered                                              Post partum procedures:None Augmentation: Cytotec and IP Foley Complications: None  Hospital course: Induction of Labor With Vaginal Delivery   24y.o. yo [id number] 341w0das admitted to the hospital 06/29/2020 for induction of labor.  Indication for induction: Cholestasis of pregnancy.  Patient had an uncomplicated labor course as follows: Membrane Rupture Time/Date: 12:32 PM ,06/29/2020   Delivery Method:Vaginal, Spontaneous  Episiotomy: None  Lacerations:  None  Details of delivery can be found in separate delivery note.  Patient had a routine postpartum course. Patient is discharged home 07/01/20.  Newborn Data: Birth date:06/29/2020  Birth time:5:55 PM  Gender:Female  Living status:Living  Apgars:9 ,9  Weight:2841 g   Magnesium Sulfate received: No BMZ received: No Rhophylac:N/A MMR:N/A T-DaP:Given postpartum Flu: N/A Transfusion:No  Physical exam  Vitals:   06/30/20 0530 06/30/20 1540 06/30/20 2057 07/01/20 0512  BP: 96/67 (!) 87/60 91/62 99/64   Pulse: 78 68 77 69  Resp: 18 20 19 18   Temp: 98.2 F (36.8 C) 98.1 F (36.7 C) 97.9 F (36.6 C) 98 F (36.7 C)  TempSrc: Oral Oral Oral Oral  SpO2: 99%  99% 100%  Weight:      Height:        General: alert and cooperative Lochia: appropriate Uterine Fundus: firm Incision: N/A DVT Evaluation: No evidence of DVT seen on physical exam. Labs: Lab Results  Component Value Date   WBC 11.0 (H) 06/29/2020   HGB 9.3 (L) 06/29/2020   HCT 31.9 (L) 06/29/2020   MCV 75.8 (L) 06/29/2020   PLT 362 06/29/2020   CMP Latest Ref Rng & Units 05/29/2020  Glucose 65 - 99 mg/dL 77  BUN 6 - 20 mg/dL 7  Creatinine 0.57 - 1.00 mg/dL 0.59  Sodium 134 - 144 mmol/L 137  Potassium 3.5 - 5.2 mmol/L 4.0  Chloride 96 - 106 mmol/L 103  CO2 20 - 29 mmol/L 20  Calcium 8.7 - 10.2 mg/dL 9.1  Total Protein 6.0 - 8.5 g/dL 6.2  Total Bilirubin 0.0 - 1.2 mg/dL 0.6  Alkaline Phos 48 - 121 IU/L 210(H)  AST 0 - 40 IU/L 167(H)  ALT 0 - 32 IU/L 167(H)   EdFlavia Shippercore: Edinburgh Postnatal Depression Scale Screening Tool 06/29/2020  I have been able to laugh and see the funny side of things. (No Data)     After visit meds:  Allergies as of 07/01/2020   No Known Allergies     Medication List    STOP taking these medications   ursodiol 500 MG tablet Commonly known as: ACTIGALL     TAKE these  medications   Blood Pressure Kit Devi 1 kit by Does not apply route once a week. Check Blood Pressure regularly and record readings into the [employer] App.  Large Cuff.  DX O90.0   diphenhydrAMINE 25 MG tablet Commonly known as: BENADRYL Take 2 tablets (50 mg total) by mouth every 6 (six) hours as needed for itching.   ibuprofen 600 MG tablet Commonly known as: ADVIL Take 1 tablet (600 mg total) by mouth every 6 (six) hours as needed.   Vitafol Gummies 3.33-0.333-34.8 MG Chew Chew 1 tablet by mouth daily.        Discharge home in stable condition Infant Feeding: Bottle and Breast Infant Disposition:home with mother Discharge instruction: per After Visit Summary and Postpartum booklet. Activity: Advance as tolerated. Pelvic rest for 6 weeks.  Diet: routine diet Future Appointments: Future  Appointments  Date Time Provider [place]  07/29/2020  9:15 AM Leftwich-Kirby, Kathie Dike, CNM [facility] None   Follow up Visit:  Follow-up [place]. Schedule an appointment as soon as possible for a visit in 4 week(s).   Specialty: Obstetrics and Gynecology Why: Make appointment to be seen in 4 weeks for postpartum appointment  Contact information: [address] [place] [phone]               Please schedule this patient for a In person postpartum visit in 4 weeks with the following provider: Any provider. Additional Postpartum F/U:none  Low risk pregnancy complicated by: n/a Delivery mode:  Vaginal, Spontaneous  Anticipated Birth Control:  Depo   07/01/2020 Melina Schools, DO

## 2020-06-29 NOTE — Discharge Instructions (Signed)

## 2020-06-30 NOTE — Anesthesia Postprocedure Evaluation (Signed)
Anesthesia Post Note  Patient: Shaunda Tipping  Procedure(s) Performed: AN AD HOC LABOR EPIDURAL     Patient location during evaluation: Mother Baby Anesthesia Type: Epidural Level of consciousness: awake and alert Pain management: pain level controlled Vital Signs Assessment: post-procedure vital signs reviewed and stable Respiratory status: spontaneous breathing, nonlabored ventilation and respiratory function stable Cardiovascular status: stable Postop Assessment: no headache, no backache and epidural receding Anesthetic complications: no   No complications documented.  Last Vitals:  Vitals:   06/30/20 0130 06/30/20 0530  BP: 102/67 96/67  Pulse: 75 78  Resp: 16 18  Temp: 36.6 C 36.8 C  SpO2: 99% 99%    Last Pain:  Vitals:   06/30/20 0705  TempSrc:   PainSc: 0-No pain   Pain Goal:                   Tanetta Fuhriman

## 2020-06-30 NOTE — Progress Notes (Signed)
Post Partum Day 1 Subjective: no complaints, up ad lib, voiding and tolerating PO  Objective: Blood pressure 96/67, pulse 78, temperature 98.2 F (36.8 C), temperature source Oral, resp. rate 18, height 4\' 11"  (1.499 m), weight 150 lb 9.6 oz (68.3 kg), last menstrual period 10/03/2019, SpO2 99 %, unknown if currently breastfeeding.  Physical Exam:  General: alert, cooperative, fatigued and no distress Lochia: appropriate Uterine Fundus: firm Incision: N/A DVT Evaluation: No evidence of DVT seen on physical exam.  Recent Labs    06/29/20 0052  HGB 9.3*  HCT 31.9*    Assessment/Plan: Plan for discharge tomorrow   LOS: 1 day   08/29/20, CNM, MSN, [facility] 06/30/20 11:06 AM

## 2020-07-01 MED ORDER — IBUPROFEN 600 MG PO TABS: 600.0000 mg | ORAL_TABLET | Freq: Four times a day (QID) | ORAL | 0 refills | Status: DC | PRN

## 2020-07-01 NOTE — Progress Notes (Signed)
Karen interpreters Gideon #[id number] and Naw #[id number] used to review all d/c instructions in detail.  Edinburgh also completed this way.  All questions answered. Support person at bedside. 

## 2020-07-03 ENCOUNTER — Ambulatory Visit: Payer: Medicaid Other

## 2020-07-22 DIAGNOSIS — Z419 Encounter for procedure for purposes other than remedying health state, unspecified: Secondary | ICD-10-CM | POA: Diagnosis not present

## 2020-07-25 ENCOUNTER — Ambulatory Visit (INDEPENDENT_AMBULATORY_CARE_PROVIDER_SITE_OTHER): Payer: Medicaid Other

## 2020-07-25 DIAGNOSIS — Z23 Encounter for immunization: Secondary | ICD-10-CM | POA: Diagnosis not present

## 2020-07-25 NOTE — Progress Notes (Signed)
   Covid-19 Vaccination Clinic  Name:  [name]    MRN: [id number] DOB: July 02, 1996  07/25/2020  [name] was observed post Covid-19 immunization for 15 minutes without incident. She was provided with Vaccine Information Sheet and instruction to access the V-Safe system.   [name] was instructed to call 911 with any severe reactions post vaccine: Marland Kitchen Difficulty breathing  . Swelling of face and throat  . A fast heartbeat  . A bad rash all over body  . Dizziness and weakness   Immunizations Administered    Name Date Dose VIS Date Route   Pfizer COVID-19 Vaccine 07/25/2020 10:08 AM 0.3 mL 01/15/2019 Intramuscular   Manufacturer: [employer], [employer]   Lot: [id number]   NDC: [address]-7

## 2020-07-29 ENCOUNTER — Other Ambulatory Visit: Payer: Self-pay

## 2020-07-29 ENCOUNTER — Ambulatory Visit (INDEPENDENT_AMBULATORY_CARE_PROVIDER_SITE_OTHER): Payer: Medicaid Other | Admitting: Advanced Practice Midwife

## 2020-07-29 ENCOUNTER — Encounter: Payer: Self-pay | Admitting: Advanced Practice Midwife

## 2020-07-29 VITALS — BP 105/74 | HR 84 | Wt 134.0 lb

## 2020-07-29 DIAGNOSIS — Z23 Encounter for immunization: Secondary | ICD-10-CM

## 2020-07-29 DIAGNOSIS — Z3042 Encounter for surveillance of injectable contraceptive: Secondary | ICD-10-CM

## 2020-07-29 DIAGNOSIS — Z3009 Encounter for other general counseling and advice on contraception: Secondary | ICD-10-CM

## 2020-07-29 LAB — POCT URINE PREGNANCY: Preg Test, Ur: NEGATIVE

## 2020-07-29 MED ORDER — MEDROXYPROGESTERONE ACETATE 150 MG/ML IM SUSP: 150.0000 mg | INTRAMUSCULAR | 0 refills | Status: DC

## 2020-07-29 MED ORDER — MEDROXYPROGESTERONE ACETATE 150 MG/ML IM SUSP
150.0000 mg | Freq: Once | INTRAMUSCULAR | Status: AC
  Administered 2020-07-29: 150 mg via INTRAMUSCULAR

## 2020-07-29 NOTE — Patient Instructions (Signed)
Contraceptive Injection A contraceptive injection is a shot that prevents pregnancy. It is also called the birth control shot. The shot contains the hormone progestin, which prevents pregnancy by:  Stopping the ovaries from releasing eggs.  Thickening cervical mucus to prevent sperm from entering the cervix.  Thinning the lining of the uterus to prevent a fertilized egg from attaching to the uterus. Contraceptive injections are given under the skin (subcutaneous) or into a muscle (intramuscular). For these shots to work, you must get one of them every 3 months (12 weeks) from a health care provider. Tell a health care provider about:  Any allergies you have.  All medicines you are taking, including vitamins, herbs, eye drops, creams, and over-the-counter medicines.  Any blood disorders you have.  Any medical conditions you have.  Whether you are pregnant or may be pregnant. What are the risks? Generally, this is a safe procedure. However, problems may occur, including:  Mood changes or depression.  Loss of bone density (osteoporosis) after long-term use.  Blood clots.  Higher risk of an egg being fertilized outside your uterus (ectopic pregnancy).This is rare. What happens before the procedure?  Your health care provider may do a routine physical exam.  You may have a test to make sure you are not pregnant. What happens during the procedure?  The area where the shot will be given will be cleaned and sanitized with alcohol.  A needle will be inserted into a muscle in your upper arm or buttock, or into the skin of your thigh or abdomen. The needle will be attached to a syringe with the medicine inside of it.  The medicine will be pushed through the syringe and injected into your body.  A small bandage (dressing) may be placed over the injection site. What can I expect after the procedure?  After the procedure, it is common to have: ? Soreness around the injection site for  a couple of days. ? Irregular menstrual bleeding. ? Weight gain. ? Breast tenderness. ? Headaches. ? Discomfort in your abdomen.  Ask your health care provider whether you need to use an added method of birth control (backup contraception), such as a condom, sponge, or spermicide. ? If the first shot is given 1-7 days after the start of your last period, you will not need backup contraception. ? If the first shot is given at any other time during your menstrual cycle, you should avoid having sex or you will need backup contraception for 7 days after you receive the shot. Follow these instructions at home: General instructions   Take over-the-counter and prescription medicines only as told by your health care provider.  Do not massage the injection site.  Track your menstrual periods so you will know if they become irregular.  Always use a condom to protect against STIs (sexually transmitted infections).  Make sure you schedule an appointment in time for your next shot, and mark it on your calendar. For the birth control to prevent pregnancy, you must get the injections every 3 months (12 weeks). Lifestyle  Do not use any products that contain nicotine or tobacco, such as cigarettes and e-cigarettes. If you need help quitting, ask your health care provider.  Eat foods that are high in calcium and vitamin D, such as milk, cheese, and salmon. Doing this may help with any loss in bone density that is caused by the contraceptive injection. Ask your health care provider for dietary recommendations. Contact a health care provider if:  You   have nausea or vomiting.  You have abnormal vaginal discharge or bleeding.  You miss a period or you think you might be pregnant.  You experience mood changes or depression.  You feel dizzy or light-headed.  You have leg pain. Get help right away if:  You have chest pain.  You cough up blood.  You have shortness of breath.  You have a  severe headache that does not go away.  You have numbness in any part of your body.  You have slurred speech.  You have vision problems.  You have vaginal bleeding that is abnormally heavy or does not stop.  You have severe pain in your abdomen.  You have depression that does not get better with treatment. If you ever feel like you may hurt yourself or others, or have thoughts about taking your own life, get help right away. You can go to your nearest emergency department or call:  Your local emergency services (911 in the [place]  A suicide crisis helpline, such as the National Suicide Prevention Lifeline at 1-800-273-8255. This is open 24 hours a day. Summary  A contraceptive injection is a shot that prevents pregnancy. It is also called the birth control shot.  The shot is given under the skin (subcutaneous) or into a muscle (intramuscular).  After this procedure, it is common to have soreness around the injection site for a couple of days.  To prevent pregnancy, the shot must be given by a health care provider every 3 months (12 weeks).  After you have the shot, ask your health care provider whether you need to use an added method of birth control (backup contraception), such as a condom, sponge, or spermicide. This information is not intended to replace advice given to you by your health care provider. Make sure you discuss any questions you have with your health care provider. Document Revised: 10/20/2017 Document Reviewed: 07/03/2017 Elsevier Patient Education  2020 [employer]  

## 2020-07-29 NOTE — Progress Notes (Signed)
Office supply Depo given LD without difficulty Next Depo due 11/24 - 12/8

## 2020-07-29 NOTE — Progress Notes (Signed)
Post Partum Visit Note  [name] is a 24 y.o. [phone] female who presents for a postpartum visit. She is 4 weeks postpartum following a normal spontaneous vaginal delivery.  I have fully reviewed the prenatal and intrapartum course. The delivery was at 37 gestational weeks.  Anesthesia: epidural. Postpartum course has been unremarkable. Baby is doing well. Baby is feeding by both breast and bottle - Similac with Iron and [name]. Bleeding no bleeding. Bowel function is normal. Bladder function is normal. Patient is not sexually active. Contraception method is none. Postpartum depression screening: negative. EPDS = 0   The pregnancy intention screening data noted above was reviewed. Potential methods of contraception were discussed. The patient elected to proceed with Hormonal Injection.    Edinburgh Postnatal Depression Scale - 07/29/20 0929      Edinburgh Postnatal Depression Scale:  In the Past 7 Days   I have been able to laugh and see the funny side of things. 0    I have looked forward with enjoyment to things. 0    I have blamed myself unnecessarily when things went wrong. 0    I have been anxious or worried for no good reason. 0    I have felt scared or panicky for no good reason. 0    Things have been getting on top of me. 0    I have been so unhappy that I have had difficulty sleeping. 0    I have felt sad or miserable. 0    I have been so unhappy that I have been crying. 0    The thought of harming myself has occurred to me. 0    Edinburgh Postnatal Depression Scale Total 0            The following portions of the patient's history were reviewed and updated as appropriate: allergies, current medications, past family history, past medical history, past social history, past surgical history and problem list.  Review of Systems Pertinent items noted in HPI and remainder of comprehensive ROS otherwise negative.    Objective:  Blood pressure 105/74, pulse 84, weight  134 lb (60.8 kg), last menstrual period 10/03/2019, unknown if currently breastfeeding.  BP 105/74   Pulse 84   Wt 134 lb (60.8 kg)   LMP 10/03/2019 (Exact Date)   BMI 27.06 kg/m   VS reviewed, nursing note reviewed,  Constitutional: well developed, well nourished, no distress HEENT: normocephalic CV: normal rate Pulm/chest wall: normal effort Abdomen: soft Neuro: alert and oriented x 3 Skin: warm, dry Psych: affect normal        Assessment:    Normal postpartum exam.  Plan:   Essential components of care per ACOG recommendations:  1.  Mood and well being: Patient with negative depression screening today. Reviewed local resources for support.  - Patient does not use tobacco. - hx of drug use? No    2. Infant care and feeding:  -Patient currently breastmilk feeding? Yes  If breastmilk feeding discussed return to work and pumping. If needed, patient was provided letter for work to allow for every 2-3 hr pumping breaks, and to be granted a private location to express breastmilk and refrigerated area to store breastmilk. Reviewed importance of draining breast regularly to support lactation. -Social determinants of health (SDOH) reviewed in EPIC. No concerns  3. Sexuality, contraception and birth spacing - Patient does not want a pregnancy in the next year.   - Reviewed forms of contraception in tiered fashion. Patient  desired Depo-Provera today.   - Discussed birth spacing of 18 months  4. Sleep and fatigue -Encouraged family/partner/community support of 4 hrs of uninterrupted sleep to help with mood and fatigue  5. Physical Recovery  - Discussed patients delivery which was without complication - Patient had no laceration and no repair, perineal healing reviewed. Patient expressed understanding - Patient has urinary incontinence? No  - Patient is safe to resume physical and sexual activity  6.  Health Maintenance - Last pap smear done 12/27/19 and was normal with  negative HPV.   [name], CNM Center for [employer], [facility]

## 2020-08-15 ENCOUNTER — Ambulatory Visit (INDEPENDENT_AMBULATORY_CARE_PROVIDER_SITE_OTHER): Payer: Medicaid Other

## 2020-08-15 ENCOUNTER — Other Ambulatory Visit: Payer: Self-pay

## 2020-08-15 DIAGNOSIS — Z23 Encounter for immunization: Secondary | ICD-10-CM

## 2020-08-15 NOTE — Progress Notes (Signed)
   Covid-19 Vaccination Clinic  Name:  Lakhia Gengler    MRN: [id number] DOB: [birth year]  08/15/2020  Ms. Mcvicker was observed post Covid-19 immunization for 15 minutes without incident. She was provided with Vaccine Information Sheet and instruction to access the V-Safe system.   Ms. Davoli was instructed to call 911 with any severe reactions post vaccine: Marland Kitchen Difficulty breathing  . Swelling of face and throat  . A fast heartbeat  . A bad rash all over body  . Dizziness and weakness   Immunizations Administered    Name Date Dose VIS Date Route   Pfizer COVID-19 Vaccine 08/15/2020 10:30 AM 0.3 mL 01/15/2019 Intramuscular   Manufacturer: [employer], [employer]   Lot: [id number]   NDC: [id number]

## 2020-08-21 ENCOUNTER — Other Ambulatory Visit: Payer: Medicaid Other

## 2020-08-21 DIAGNOSIS — Z419 Encounter for procedure for purposes other than remedying health state, unspecified: Secondary | ICD-10-CM | POA: Diagnosis not present

## 2020-09-21 DIAGNOSIS — Z419 Encounter for procedure for purposes other than remedying health state, unspecified: Secondary | ICD-10-CM | POA: Diagnosis not present

## 2020-10-19 ENCOUNTER — Ambulatory Visit: Payer: Medicaid Other

## 2020-10-19 ENCOUNTER — Other Ambulatory Visit: Payer: Self-pay

## 2020-10-21 DIAGNOSIS — Z419 Encounter for procedure for purposes other than remedying health state, unspecified: Secondary | ICD-10-CM | POA: Diagnosis not present

## 2020-10-22 ENCOUNTER — Other Ambulatory Visit: Payer: Self-pay

## 2020-10-22 ENCOUNTER — Ambulatory Visit (INDEPENDENT_AMBULATORY_CARE_PROVIDER_SITE_OTHER): Payer: Medicaid Other | Admitting: Obstetrics and Gynecology

## 2020-10-22 VITALS — BP 114/82 | HR 87 | Wt 135.0 lb

## 2020-10-22 DIAGNOSIS — Z3009 Encounter for other general counseling and advice on contraception: Secondary | ICD-10-CM

## 2020-10-22 MED ORDER — NORGESTIMATE-ETH ESTRADIOL 0.25-35 MG-MCG PO TABS
1.0000 | ORAL_TABLET | Freq: Every day | ORAL | 11 refills | Status: DC
Start: 2020-10-22 — End: 2022-07-21

## 2020-10-22 NOTE — Progress Notes (Signed)
Pt is interested in pill for contraception. Pt has been on Depo, last given in Sept.

## 2020-10-22 NOTE — Progress Notes (Signed)
    GYNECOLOGY ENCOUNTER NOTE  History:     [name] is a 24 y.o. [phone] female here for discussion of BC. The patient speaks [name]; an interpretor was used for the entire visit. The patient is currently using Depo for [facility]. She is interested in speaking about different options. She is not interested in having any more children; however she is not interested in having a BTL.  We discussed all of the different types of BC options from most effective to least effective. All questions answered.    Gynecologic History Patient's last menstrual period was 10/16/2020. Contraception: oral progesterone-only contraceptive   Obstetric History OB History  Gravida Para Term Preterm AB Living  3 3 2 1  0 3  SAB TAB Ectopic Multiple Live Births  0 0 0 0 3    # Outcome Date GA Lbr Len/2nd Weight Sex Delivery Anes PTL Lv  3 Term 06/29/20 [redacted]w[redacted]d 15:36 / 00:39 6 lb 4.2 oz (2.841 kg) M Vag-Spont EPI  LIV  2 Term 11/12/15 [redacted]w[redacted]d 11:30 / 00:11 6 lb 3.8 oz (2.83 kg) M Vag-Spont EPI  LIV     Birth Comments: [place], otherwise WNL  1 Preterm 07/06/11 [redacted]w[redacted]d 42:40 / 01:52 4 lb 5.1 oz (1.96 kg) F Vag-Spont None  LIV    Past Medical History:  Diagnosis Date  . Medical history non-contributory   . No pertinent past medical history     Past Surgical History:  Procedure Laterality Date  . NO PAST SURGERIES      Current Outpatient Medications on File Prior to Visit  Medication Sig Dispense Refill  . ibuprofen (ADVIL) 600 MG tablet Take 1 tablet (600 mg total) by mouth every 6 (six) hours as needed. 30 tablet 0  . medroxyPROGESTERone (DEPO-PROVERA) 150 MG/ML injection Inject 1 mL (150 mg total) into the muscle every 3 (three) months. (Patient not taking: Reported on 10/22/2020) 1 mL 0  . Prenatal Vit-Fe Phos-FA-Omega (VITAFOL GUMMIES) 3.33-0.333-34.8 MG CHEW Chew 1 tablet by mouth daily.  (Patient not taking: Reported on 07/29/2020)     No current facility-administered medications on file prior to visit.     No Known Allergies  Social History:  reports that she has never smoked. She has never used smokeless tobacco. She reports that she does not drink alcohol and does not use drugs.  No family history on file.  The following portions of the patient's history were reviewed and updated as appropriate: allergies, current medications, past family history, past medical history, past social history, past surgical history and problem list.  Review of Systems Pertinent items noted in HPI and remainder of comprehensive ROS otherwise negative.  Physical Exam:  BP 114/82   Pulse 87   Wt 135 lb (61.2 kg)   LMP 10/16/2020   BMI 27.27 kg/m  CONSTITUTIONAL: Well-developed, well-nourished female in no acute distress.  HENT:  Normocephalic EYES: Conjunctivae and EOM are normal. Pupils are equal, round, and reactive to light.   NEUROLOGIC: Alert and oriented to person, place, and time. 10/18/2020  PSYCHIATRIC: Normal mood and affect.   Assessment and Plan:    1. Birth control counseling  Rx: Spritec Patient is interested in IUD insertion and would like to come back for this. Stop Depo    Serenity Batley, Marland Kitchen, NP Faculty Practice [facility] for [facility], [facility]

## 2020-10-23 ENCOUNTER — Other Ambulatory Visit: Payer: Self-pay | Admitting: Family Medicine

## 2020-10-30 ENCOUNTER — Other Ambulatory Visit: Payer: Self-pay | Admitting: Certified Nurse Midwife

## 2020-11-03 ENCOUNTER — Other Ambulatory Visit: Payer: Self-pay | Admitting: Certified Nurse Midwife

## 2020-11-07 ENCOUNTER — Other Ambulatory Visit: Payer: Self-pay | Admitting: Certified Nurse Midwife

## 2020-11-11 ENCOUNTER — Other Ambulatory Visit: Payer: Self-pay | Admitting: Certified Nurse Midwife

## 2020-11-15 ENCOUNTER — Other Ambulatory Visit: Payer: Self-pay | Admitting: Certified Nurse Midwife

## 2020-11-19 ENCOUNTER — Other Ambulatory Visit: Payer: Self-pay | Admitting: Certified Nurse Midwife

## 2020-11-21 DIAGNOSIS — Z419 Encounter for procedure for purposes other than remedying health state, unspecified: Secondary | ICD-10-CM | POA: Diagnosis not present

## 2020-12-15 ENCOUNTER — Other Ambulatory Visit: Payer: Self-pay

## 2020-12-15 ENCOUNTER — Ambulatory Visit (INDEPENDENT_AMBULATORY_CARE_PROVIDER_SITE_OTHER): Payer: Medicaid Other | Admitting: Advanced Practice Midwife

## 2020-12-15 ENCOUNTER — Encounter: Payer: Self-pay | Admitting: Advanced Practice Midwife

## 2020-12-15 VITALS — BP 105/76 | HR 81 | Wt 136.0 lb

## 2020-12-15 DIAGNOSIS — Z3009 Encounter for other general counseling and advice on contraception: Secondary | ICD-10-CM | POA: Diagnosis not present

## 2020-12-15 DIAGNOSIS — Z3042 Encounter for surveillance of injectable contraceptive: Secondary | ICD-10-CM | POA: Diagnosis not present

## 2020-12-15 MED ORDER — MEDROXYPROGESTERONE ACETATE 150 MG/ML IM SUSP: 150.0000 mg | INTRAMUSCULAR | 4 refills | Status: DC

## 2020-12-15 MED ORDER — MEDROXYPROGESTERONE ACETATE 150 MG/ML IM SUSP
150.0000 mg | Freq: Once | INTRAMUSCULAR | Status: AC
  Administered 2020-12-15: 150 mg via INTRAMUSCULAR

## 2020-12-15 NOTE — Progress Notes (Signed)
  GYNECOLOGY PROGRESS NOTE  History:  25 y.o. [id number] presents to [facility] office today for contraceptive visit. She reports no gyn concerns or problems. She is taking OCPs as prescribed but is concerned about missing a pill and becoming pregnant before planned. She does plan to become pregnant in another year.  She was planning an IUD but now wants to do another injection of Depo and stop the OCPs. She denies h/a, dizziness, shortness of breath, n/v, or fever/chills.    The following portions of the patient's history were reviewed and updated as appropriate: allergies, current medications, past family history, past medical history, past social history, past surgical history and problem list. Last pap smear on 12/27/19 was normal.  Review of Systems:  Pertinent items are noted in HPI.   Objective:  Physical Exam Blood pressure 105/76, pulse 81, weight 136 lb (61.7 kg), last menstrual period 12/15/2020, not currently breastfeeding. VS reviewed, nursing note reviewed,  Constitutional: well developed, well nourished, no distress HEENT: normocephalic CV: normal rate Pulm/chest wall: normal effort Breast Exam: deferred Abdomen: soft Neuro: alert and oriented x 3 Skin: warm, dry Psych: affect normal Pelvic exam: Cervix pink, visually closed, without lesion, scant white creamy discharge, vaginal walls and external genitalia normal Bimanual exam: Cervix 0/long/high, firm, anterior, neg CMT, uterus nontender, nonenlarged, adnexa without tenderness, enlargement, or mass  Assessment & Plan:  1. Encounter for management and injection of depo-Provera  - medroxyPROGESTERone (DEPO-PROVERA) injection 150 mg   2. Encounter for counseling regarding contraception --Pt is taking OCPs as prescribed but is worried about missing a pill and becoming pregnant right now.  She had planned IUD but is concerned about IUD since she plans another baby in 1 year or less.  Discussed how IUD is reversible but Depo is  not as reversible, can delay fertility for up to 1 year when stopped.  Reviewed all contraceptive options in tiered fashion. Pt desires Depo today, may not get next injection and will start trying to become pregnant.  Will plan IUD for after her next pregnancy. --F/U in 3 months for next injection if desired, first Depo dose given today --D/C oral contraceptives at this time  Sharen Counter, CNM 10:38 AM

## 2020-12-15 NOTE — Progress Notes (Signed)
RGYN for contraception per last note pt to have IUD Insertion.   Patient has started cycle today.   Per pt she is not liking taking pills pt afraid she  May miss pills and become pregnant.  considering Depo.   Ok to give Depo per provider no UPT since pt is currently on oral contraception

## 2020-12-22 DIAGNOSIS — Z419 Encounter for procedure for purposes other than remedying health state, unspecified: Secondary | ICD-10-CM | POA: Diagnosis not present

## 2021-01-19 DIAGNOSIS — Z419 Encounter for procedure for purposes other than remedying health state, unspecified: Secondary | ICD-10-CM | POA: Diagnosis not present

## 2021-02-19 DIAGNOSIS — Z419 Encounter for procedure for purposes other than remedying health state, unspecified: Secondary | ICD-10-CM | POA: Diagnosis not present

## 2021-03-09 ENCOUNTER — Ambulatory Visit: Payer: Medicaid Other

## 2021-03-21 DIAGNOSIS — Z419 Encounter for procedure for purposes other than remedying health state, unspecified: Secondary | ICD-10-CM | POA: Diagnosis not present

## 2021-04-21 DIAGNOSIS — Z419 Encounter for procedure for purposes other than remedying health state, unspecified: Secondary | ICD-10-CM | POA: Diagnosis not present

## 2021-05-21 DIAGNOSIS — Z419 Encounter for procedure for purposes other than remedying health state, unspecified: Secondary | ICD-10-CM | POA: Diagnosis not present

## 2021-06-21 DIAGNOSIS — Z419 Encounter for procedure for purposes other than remedying health state, unspecified: Secondary | ICD-10-CM | POA: Diagnosis not present

## 2021-07-16 ENCOUNTER — Encounter: Payer: Self-pay | Admitting: Obstetrics and Gynecology

## 2021-07-16 ENCOUNTER — Ambulatory Visit (INDEPENDENT_AMBULATORY_CARE_PROVIDER_SITE_OTHER): Payer: Medicaid Other | Admitting: Obstetrics and Gynecology

## 2021-07-16 ENCOUNTER — Other Ambulatory Visit: Payer: Self-pay

## 2021-07-16 VITALS — BP 104/70 | HR 92 | Wt 139.0 lb

## 2021-07-16 DIAGNOSIS — Z30430 Encounter for insertion of intrauterine contraceptive device: Secondary | ICD-10-CM

## 2021-07-16 LAB — POCT URINE PREGNANCY: Preg Test, Ur: NEGATIVE

## 2021-07-16 MED ORDER — LEVONORGESTREL 20.1 MCG/DAY IU IUD
1.0000 | INTRAUTERINE_SYSTEM | Freq: Once | INTRAUTERINE | Status: AC
  Administered 2021-07-16: 1 via INTRAUTERINE

## 2021-07-16 NOTE — Progress Notes (Signed)
   GYNECOLOGY CLINIC PROCEDURE NOTE  [name] is a 25 y.o. [phone] here for Liletta IUD insertion. No GYN concerns.  Last pap smear was on 2021 and was normal.  IUD Insertion Procedure Note Patient identified, informed consent performed, consent signed.   Discussed risks of irregular bleeding, cramping, infection, malpositioning or misplacement of the IUD outside the uterus which may require further procedure such as laparoscopy. Time out was performed.  Urine pregnancy test negative.  Speculum placed in the vagina.  Cervix visualized.  Cleaned with Betadine x 2.  Grasped anteriorly with a single tooth tenaculum.  Uterus sounded to 7 cm.  Liletta IUD placed per manufacturer's recommendations.  Strings trimmed to 3 cm. Tenaculum was removed, good hemostasis noted.  Patient tolerated procedure well.   Patient was given post-procedure instructions.  She was advised to have backup contraception for one week.  Patient was also asked to check IUD strings periodically and follow up in 4 weeks for IUD check.   Duane Lope, NP 07/16/2021 9:50 AM

## 2021-07-16 NOTE — Addendum Note (Signed)
Addended by: Natale Milch D on: 07/16/2021 10:32 AM   Modules accepted: Orders

## 2021-07-19 ENCOUNTER — Encounter: Payer: Self-pay | Admitting: Obstetrics and Gynecology

## 2021-07-22 DIAGNOSIS — Z419 Encounter for procedure for purposes other than remedying health state, unspecified: Secondary | ICD-10-CM | POA: Diagnosis not present

## 2021-08-13 ENCOUNTER — Other Ambulatory Visit: Payer: Self-pay

## 2021-08-13 ENCOUNTER — Ambulatory Visit (INDEPENDENT_AMBULATORY_CARE_PROVIDER_SITE_OTHER): Payer: Medicaid Other | Admitting: Obstetrics and Gynecology

## 2021-08-13 ENCOUNTER — Encounter: Payer: Self-pay | Admitting: Obstetrics and Gynecology

## 2021-08-13 VITALS — BP 112/79 | HR 87 | Wt 141.0 lb

## 2021-08-13 DIAGNOSIS — Z30431 Encounter for routine checking of intrauterine contraceptive device: Secondary | ICD-10-CM | POA: Diagnosis not present

## 2021-08-13 NOTE — Progress Notes (Signed)
25 yo P3 here for IUD check up. Patient had a Liletta IUD inserted on 07/16/21. She reports doing well since insertion and denies any pelvic pain, dyspareunia ir abnormal discharge. Patient reports some irregular spotting and occasional cramping pain relieved with ibuprofen.Patient is without any complaints  Past Medical History:  Diagnosis Date   Medical history non-contributory    No pertinent past medical history    .pHistory reviewed. No pertinent family history. Social History   Tobacco Use   Smoking status: Never   Smokeless tobacco: Never  Vaping Use   Vaping Use: Never used  Substance Use Topics   Alcohol use: No   Drug use: No   ROS See pertinent in HPI. All other systems reviewed and non contributory Blood pressure 112/79, pulse 87, weight 141 lb (64 kg), not currently breastfeeding. GENERAL: Well-developed, well-nourished female in no acute distress.  ABDOMEN: Soft, nontender, nondistended. No organomegaly. PELVIC: Normal external female genitalia. Vagina is pink and rugated.  Normal discharge. Normal appearing cervix with IUD strings extending 2 cm from os and curled in the anterior fornix. Uterus is normal in size. No adnexal mass or tenderness. Chaperone present during the pelvic exam EXTREMITIES: No cyanosis, clubbing, or edema, 2+ distal pulses.  A/P 25 yo P3 here for IUD check - IUD appears to be in the appropriate location - Patient with normal pap smear 12/2019 - Patient declined STI testing - RTC in 1 year or prn

## 2021-08-21 DIAGNOSIS — Z419 Encounter for procedure for purposes other than remedying health state, unspecified: Secondary | ICD-10-CM | POA: Diagnosis not present

## 2021-09-06 IMAGING — US US MFM OB DETAIL+14 WK
1 series · 13 of 28 positions shown · non-contrast
Comparison: none

[Series 1: us mfm ob detail+14 wk · 66 acquisitions, 13 frames shown]
[im 3/66]
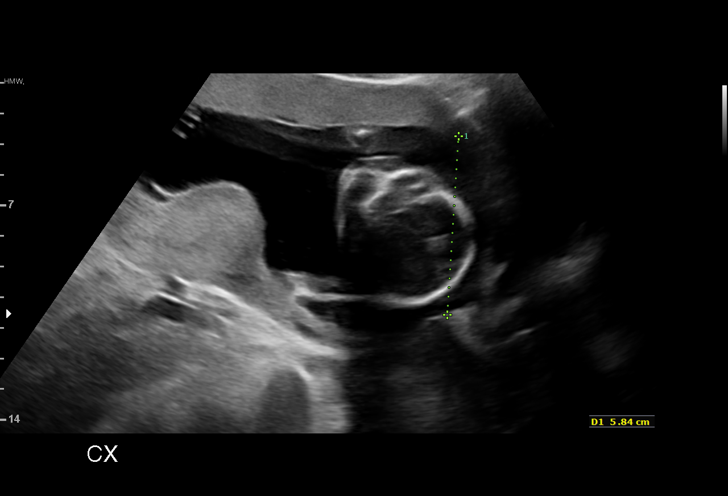
[im 8/66]
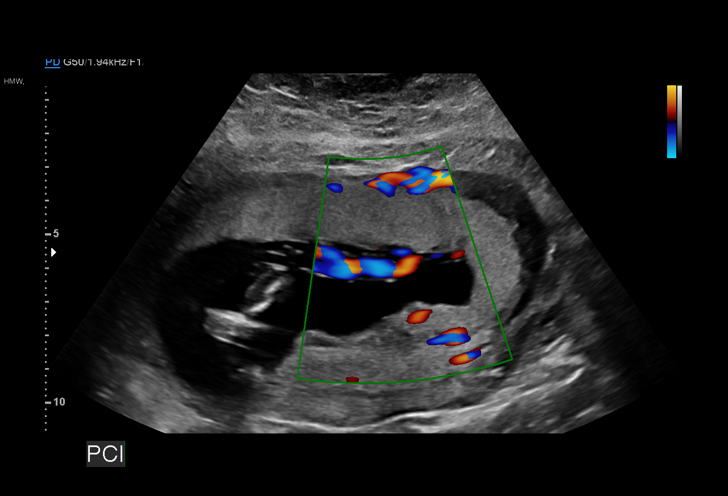
[im 13/66]
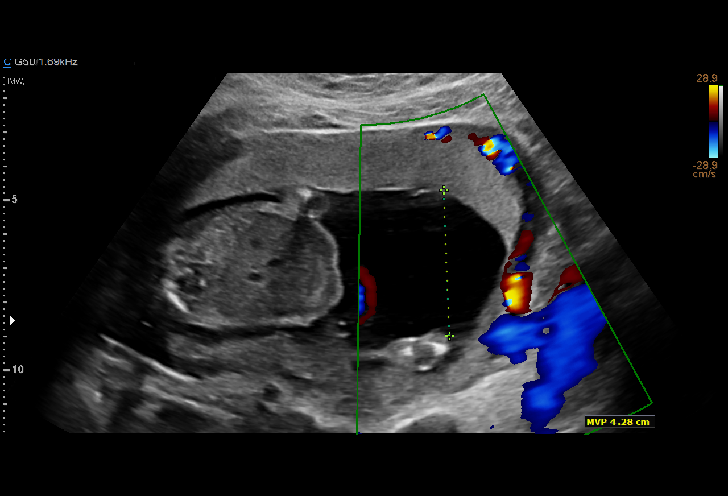
[im 17/66]
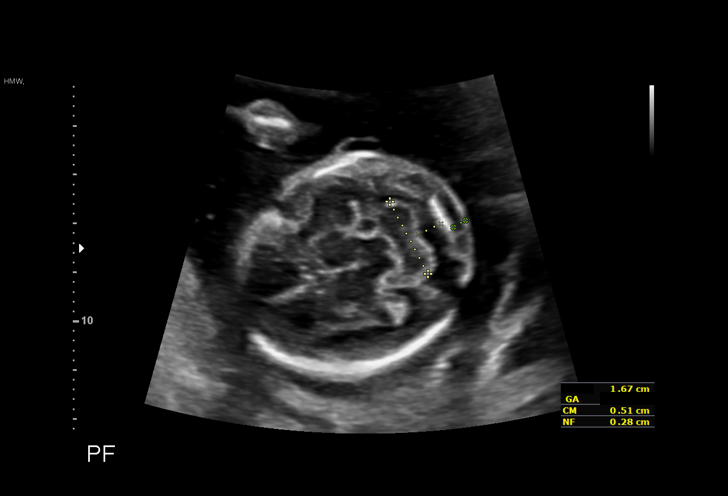
[im 22/66]
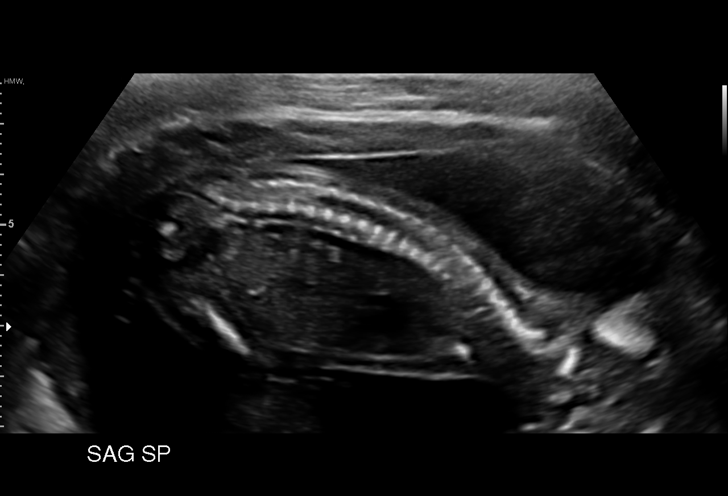
[im 27/66]
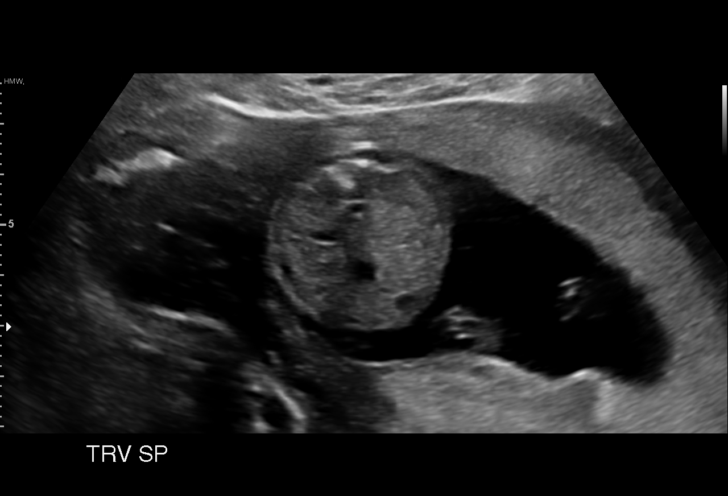
[im 34/66]
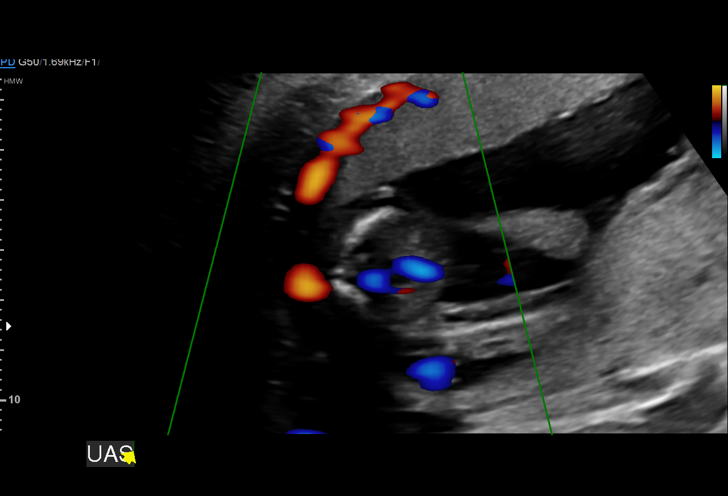
[im 39/66]
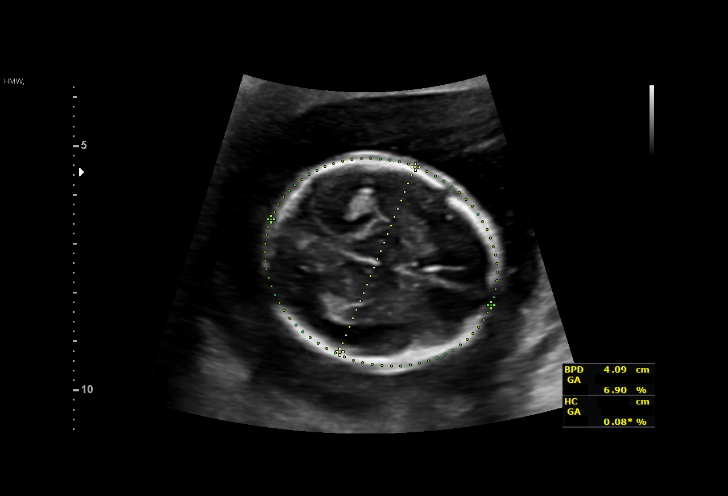
[im 44/66]
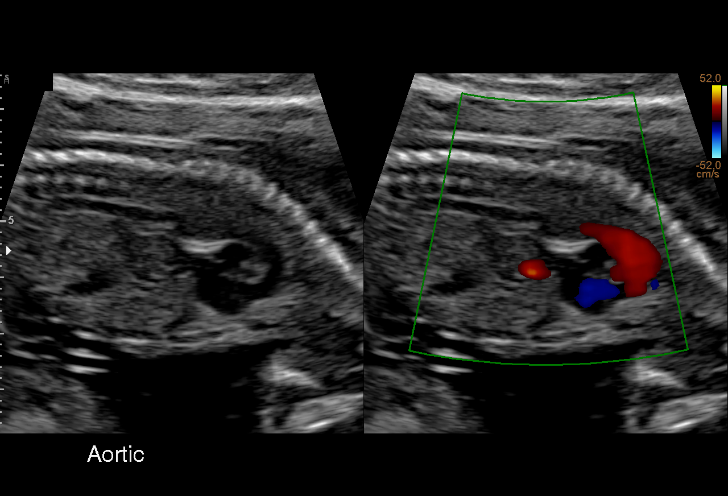
[im 49/66]
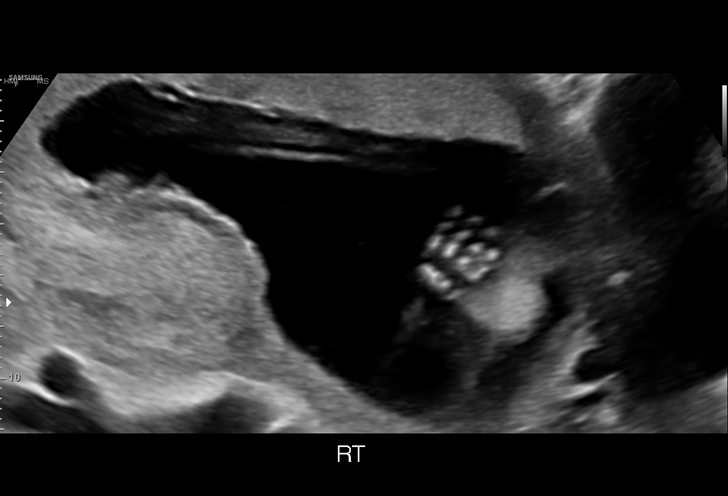
[im 53/66]
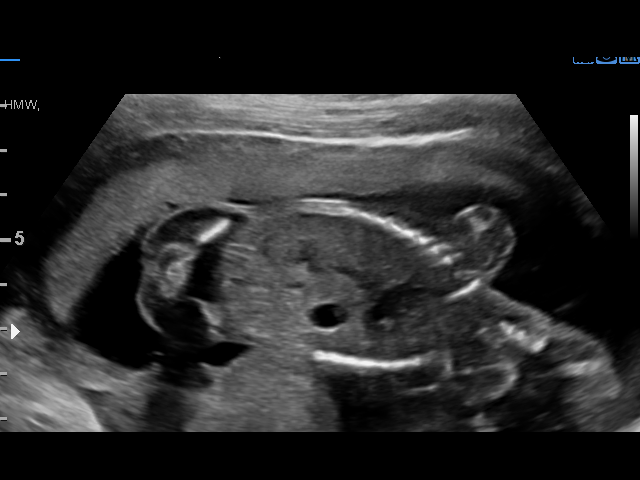
[im 58/66]
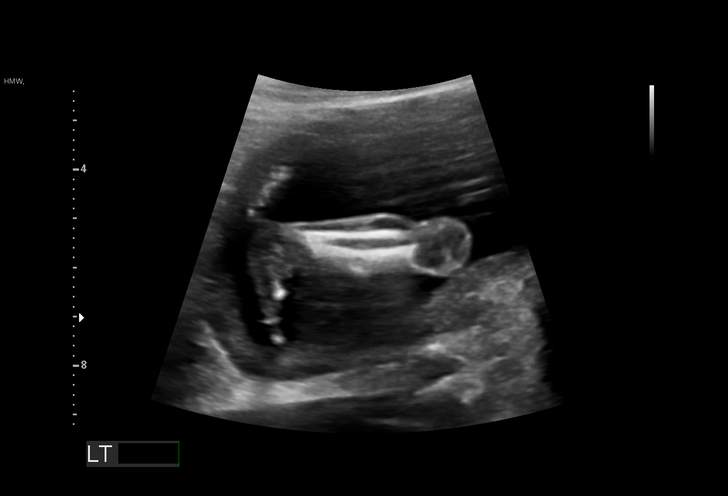
[im 63/66]
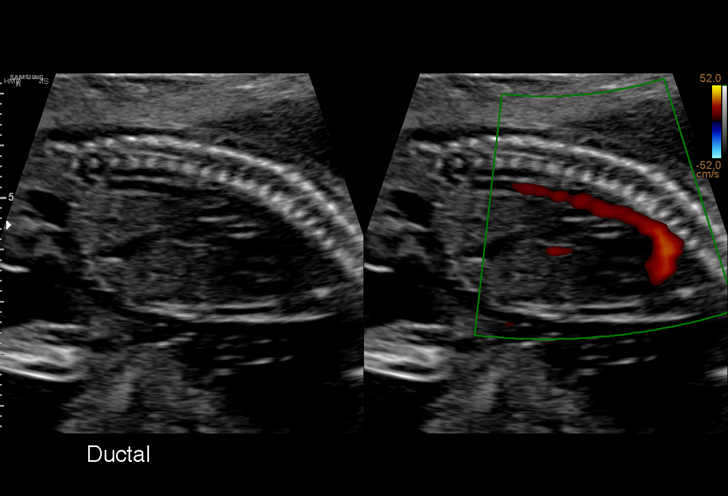

[13 of 28 positions shown; findings below may reference images not displayed]

----------------------------------------------------------------------

 ----------------------------------------------------------------------
Indications

  19 weeks gestation of pregnancy
  Encounter for antenatal screening for
  malformations
  Low risk NIPS,
  Genetic carrier (silent carrier for Alegria Tu)WZJ.O
  Poor obstetric history: Previous preterm
  delivery, antepartum
 ----------------------------------------------------------------------
Fetal Evaluation

 Num Of Fetuses:         1
 Fetal Heart Rate(bpm):  157
 Cardiac Activity:       Observed
 Presentation:           Cephalic
 Placenta:               Anterior
 P. Cord Insertion:      Visualized, central

 Amniotic Fluid
 AFI FV:      Within normal limits

                             Largest Pocket(cm)

Biometry

 BPD:      40.9  mm     G. Age:  18w 3d          7  %    CI:        82.44   %    70 - 86
                                                         FL/HC:      19.6   %    16.8 -
 HC:      142.1  mm     G. Age:  17w 3d        < 1  %    HC/AC:      1.12        1.09 -
 AC:      126.5  mm     G. Age:  18w 2d          8  %    FL/BPD:     68.0   %
 FL:       27.8  mm     G. Age:  18w 4d          9  %    FL/AC:      22.0   %    20 - 24
 HUM:      24.6  mm     G. Age:  17w 5d        < 5  %
 CER:      16.7  mm     G. Age:  16w 5d        < 5  %
 NFT:       2.8  mm
 CM:        5.1  mm

 Est. FW:     233  gm      0 lb 8 oz      2  %
OB History

 Gravidity:    3         Term:   1        Prem:   1        SAB:   0
 TOP:          0       Ectopic:  0        Living: 2
Gestational Age

 LMP:           19w 5d        Date:  10/03/19                 EDD:   07/09/20
 U/S Today:     18w 1d                                        EDD:   07/20/20
 Best:          19w 5d     Det. By:  LMP  (10/03/19)          EDD:   07/09/20
Anatomy

 Cranium:               Appears normal         LVOT:                   Not well visualized
 Cavum:                 Appears normal         Aortic Arch:            Appears normal
 Ventricles:            Appears normal         Ductal Arch:            Appears normal
 Choroid Plexus:        Appears normal         Diaphragm:              Appears normal
 Cerebellum:            Appears normal         Stomach:                Appears normal, left
                                                                       sided
 Posterior Fossa:       Appears normal         Abdomen:                Appears normal
 Nuchal Fold:           Appears normal         Abdominal Wall:         Appears nml (cord
                                                                       insert, abd wall)
 Face:                  Appears normal         Cord Vessels:           Appears normal (3
                        (orbits and profile)                           vessel cord)
 Lips:                  Not well visualized    Kidneys:                Appear normal
 Palate:                Not well visualized    Bladder:                Appears normal
 Thoracic:              Appears normal         Spine:                  Appears normal
 Heart:                 Not well visualized    Upper Extremities:      Appears normal
 RVOT:                  Not well visualized    Lower Extremities:      Appears normal

 Other:  Heels and Rt 5th digit visualized. Technically difficult due to fetal
         position.
Cervix Uterus Adnexa

 Cervix
 Length:            3.2  cm.
 Normal appearance by transabdominal scan.

 Uterus
 No abnormality visualized.

 Left Ovary
 No adnexal mass visualized.

 Right Ovary
 No adnexal mass visualized.

 Cul De Sac
 No free fluid seen.

 Adnexa
 No abnormality visualized.
Impression

 We performed fetal anatomy scan. No makers of
 aneuploidies or fetal structural defects are seen. Fetal
 biometry lacks gestational age by 11 days. Amniotic fluid is
 normal and good fetal activity is seen. Patient understands
 the limitations of ultrasound in detecting fetal anomalies.
 I explained the ultrasound findings with the help of [employer]
 interpreter.  Patient is very sure of her LMP date.  She is 4
 feet 11 inches tall.  Her previous child weighed 6 pounds 3
 ounces at birth.

 I informed her of the risk of pregnancy of ultrasound
 measurements.  Since the patient is very sure of her LMP
 date, we are not changing her EDD.  I explained if the sound
 measurements show consistently small for gestational age
 fetus, she will have frequent ultrasound monitoring for
 suspected fetal growth restriction that will be difficult to
 differentiate from a constitutionally small fetus.

 MSAFP screening showed low risk for open-neural tube
 defects.
 On cell-free fetal DNA screening, the risks of fetal
 aneuploidies are not increased.

 After ultrasound the patient met with our genetic counselor.
 You will receive a separate letter from our genetic counselor.
Recommendations

 An appointment was made for her to return in 4 weeks for
 completion of fetal anatomy and fetal growth assessment.
                 Mahabad, Alf Johan

## 2021-09-21 DIAGNOSIS — Z419 Encounter for procedure for purposes other than remedying health state, unspecified: Secondary | ICD-10-CM | POA: Diagnosis not present

## 2021-10-21 DIAGNOSIS — Z419 Encounter for procedure for purposes other than remedying health state, unspecified: Secondary | ICD-10-CM | POA: Diagnosis not present

## 2021-11-15 IMAGING — US US MFM UA CORD DOPPLER
1 series · 13 of 28 positions shown · non-contrast
Comparison: none

[Series 1: us mfm ua cord doppler · 34 acquisitions, 13 frames shown]
[im 2/34]
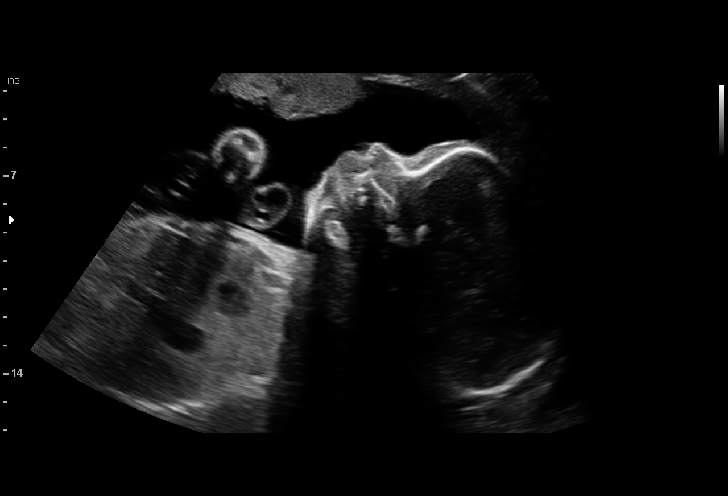
[im 4/34]
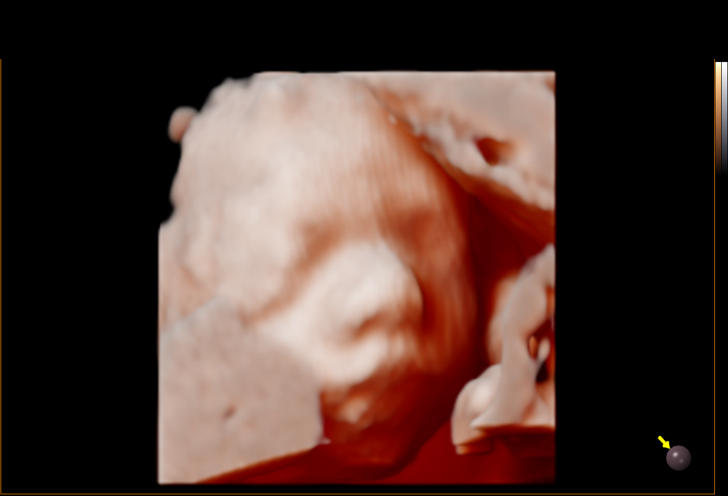
[im 7/34]
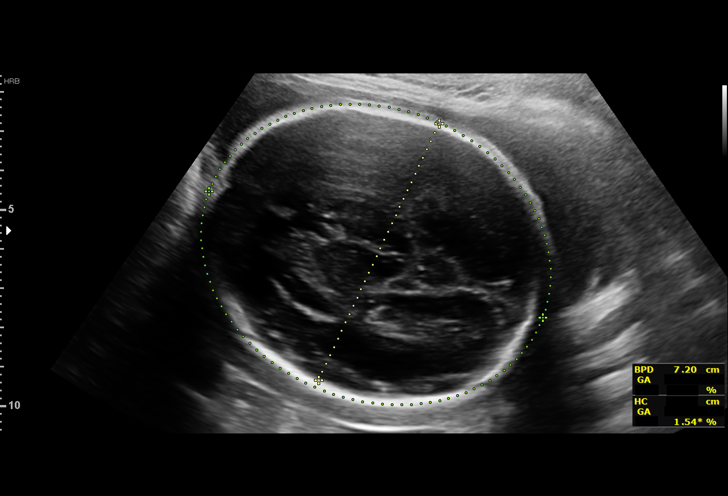
[im 9/34]
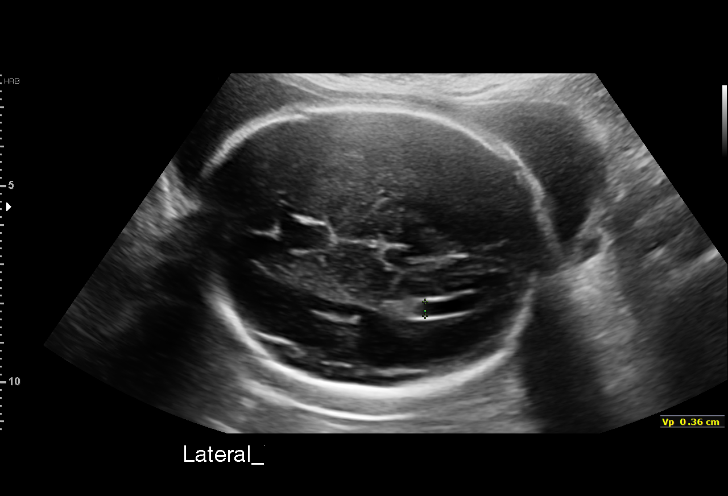
[im 12/34]
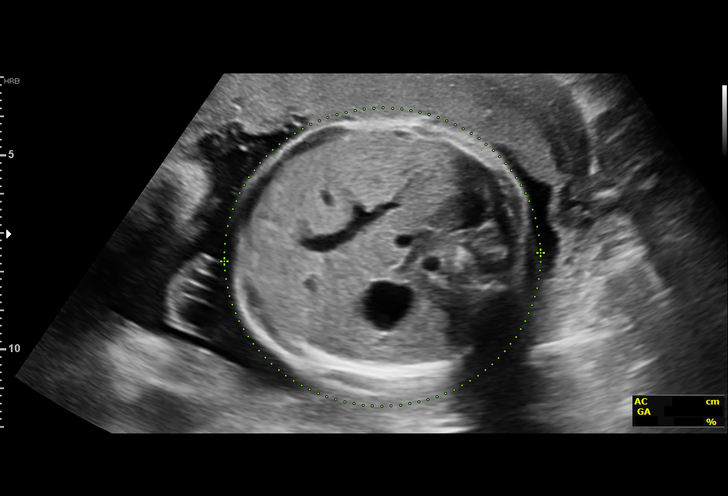
[im 14/34]
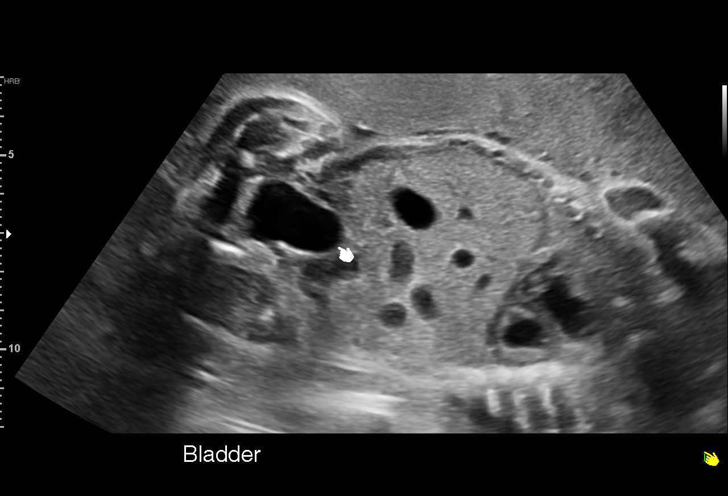
[im 18/34]
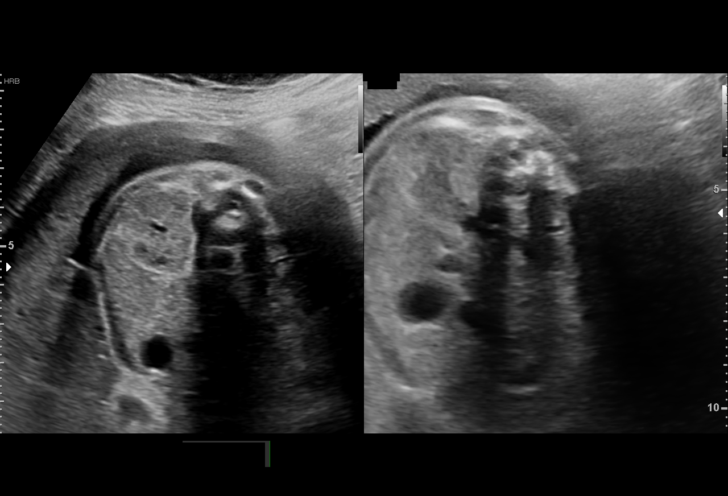
[im 20/34]
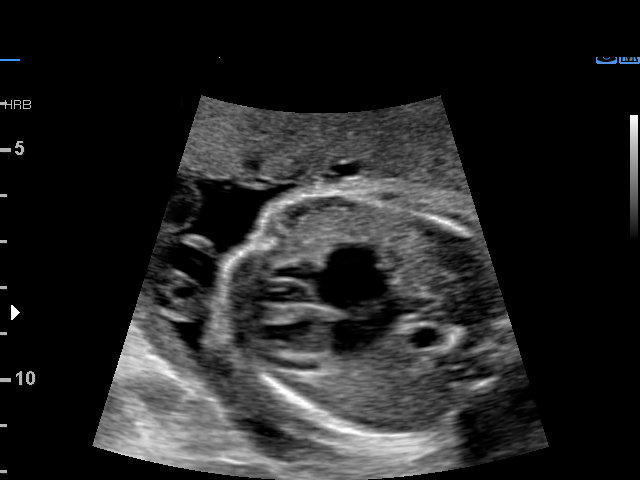
[im 23/34]
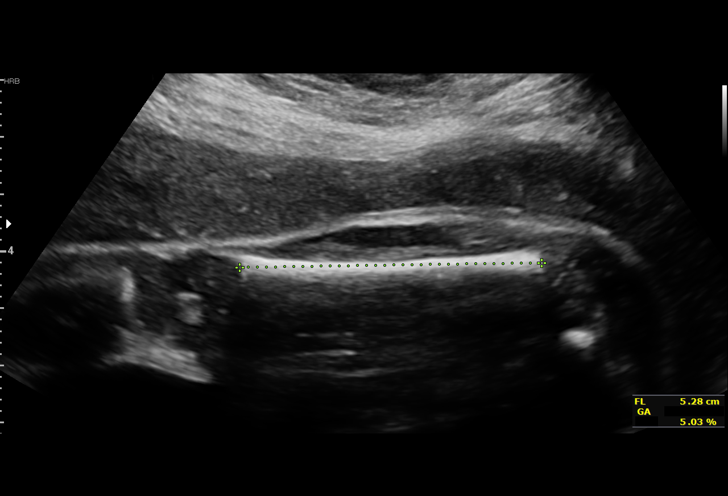
[im 25/34]
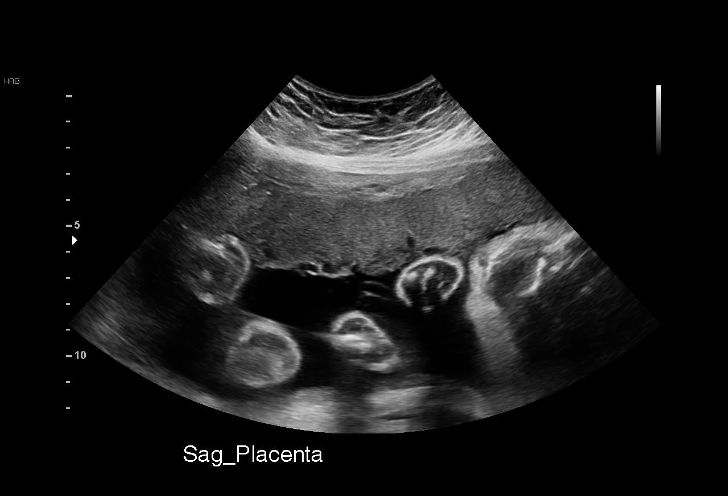
[im 27/34]
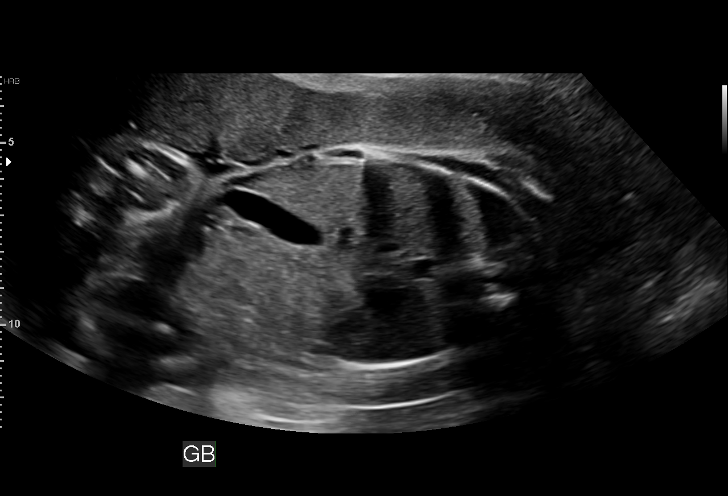
[im 30/34]
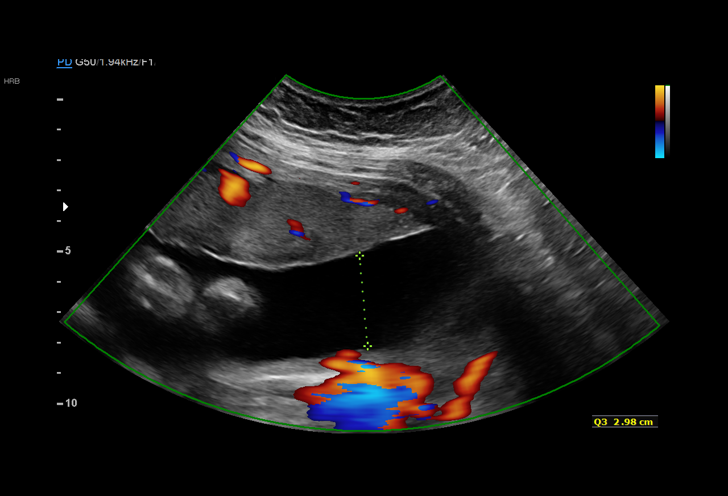
[im 32/34]
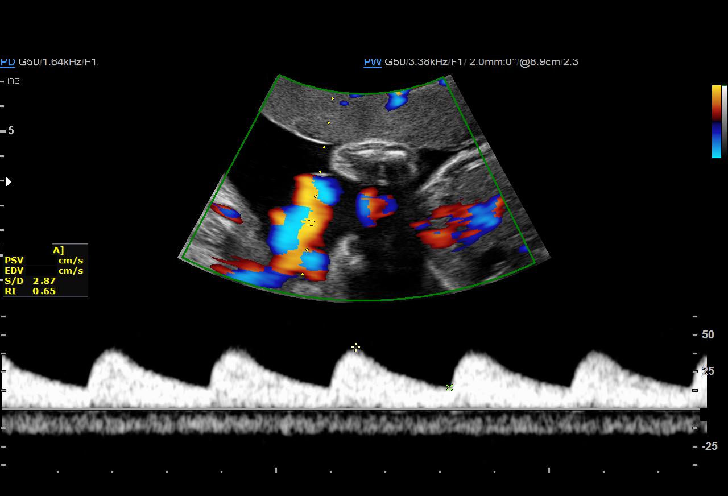

[13 of 28 positions shown; findings below may reference images not displayed]

1  US MFM UA CORD DOPPLER                76820.02    JEAN ODVALD
                                                      MURALI
                                                      MURALI

Indications

 Maternal care for known or suspected poor
 fetal growth, second trimester, not applicable
 or unspecified IUGR
 Poor obstetric history: Previous preterm
 delivery, antepartum
 Encounter for other antenatal screening
 follow-up
 Low risk NIPS,
 Genetic carrier (silent carrier for Sanu Wilkin)FMZ.P
 29 weeks gestation of pregnancy
Fetal Evaluation

 Num Of Fetuses:         1
 Fetal Heart Rate(bpm):  132
 Cardiac Activity:       Observed
 Presentation:           Cephalic
 Placenta:               Anterior
 P. Cord Insertion:      Previously Visualized

 Amniotic Fluid
 AFI FV:      Within normal limits

 AFI Sum(cm)     %Tile       Largest Pocket(cm)
 14.2            48

 RUQ(cm)       RLQ(cm)       LUQ(cm)        LLQ(cm)
 5.4           2.6           3.2            3
Biometry

 BPD:      71.1  mm     G. Age:  28w 4d         10  %    CI:        75.95   %    70 - 86
                                                         FL/HC:      20.2   %    19.2 -
 HC:      258.6  mm     G. Age:  28w 1d        < 1  %    HC/AC:      1.04        0.99 -
 AC:      249.5  mm     G. Age:  29w 1d         28  %    FL/BPD:     73.4   %    71 - 87
 FL:       52.2  mm     G. Age:  27w 6d        3.2  %    FL/AC:      20.9   %    20 - 24
 LV:        3.6  mm

 Est. FW:    4872  gm    2 lb 12 oz       9  %
OB History

 Gravidity:    3         Term:   1        Prem:   1        SAB:   0
 TOP:          0       Ectopic:  0        Living: 2
Gestational Age

 LMP:           29w 5d        Date:  10/03/19                 EDD:   07/09/20
 U/S Today:     28w 3d                                        EDD:   07/18/20
 Best:          29w 5d     Det. By:  LMP  (10/03/19)          EDD:   07/09/20
Anatomy

 Cranium:               Appears normal         Aortic Arch:            Previously seen
 Cavum:                 Appears normal         Ductal Arch:            Previously seen
 Ventricles:            Appears normal         Diaphragm:              Appears normal
 Choroid Plexus:        Previously seen        Stomach:                Appears normal, left
                                                                       sided
 Cerebellum:            Previously seen        Abdomen:                Appears normal
 Posterior Fossa:       Previously seen        Abdominal Wall:         Previously seen
 Nuchal Fold:           Not applicable (>20    Cord Vessels:           Previously seen
                        wks GA)
 Face:                  Orbits and profile     Kidneys:                Appear normal
                        previously seen
 Lips:                  Previously seen        Bladder:                Appears normal
 Thoracic:              Appears normal         Spine:                  Previously seen
 Heart:                 Previously seen        Upper Extremities:      Previously seen
 RVOT:                  Previously seen        Lower Extremities:      Previously seen
 LVOT:                  Previously seen

 Other:  Heels and Rt 5th digit previously visualized.
Doppler - Fetal Vessels

 Umbilical Artery
  S/D     %tile
  3.03       59

Cervix Uterus Adnexa

 Cervix
 Not visualized (advanced GA >21wks)
Impression

 Fetal growth restriction. Patient returned for fetal growth
 assessment and UA Doppler studies.
 The estimated fetal weight is at the 9th percentile. HC
 measurement is at between -2 to -1 SD (normal). AC
 measurement is at the 28th percentile. Amniotic fluid is
 normal and good fetal activity is seen. Umbilical artery
 Doppler study showed normal forward diastolic flow.
 We reassured the patient of the findings.
 BP 96/67 mm Hg.
Recommendations

 -BPP and UA Doppler in 2 weeks and then weekly BPP and
 UA Doppler till delivery.
 -Fetal growth in 3 weeks.
                 Besoin, Sawda Qali

## 2021-11-21 DIAGNOSIS — Z419 Encounter for procedure for purposes other than remedying health state, unspecified: Secondary | ICD-10-CM | POA: Diagnosis not present

## 2021-12-06 IMAGING — US US MFM FETAL BPP W/O NON-STRESS
1 series · 13 of 28 positions shown · non-contrast
Comparison: none

[Series 1: us mfm fetal bpp w/o non-stress · 66 acquisitions, 13 frames shown]
[im 3/66]
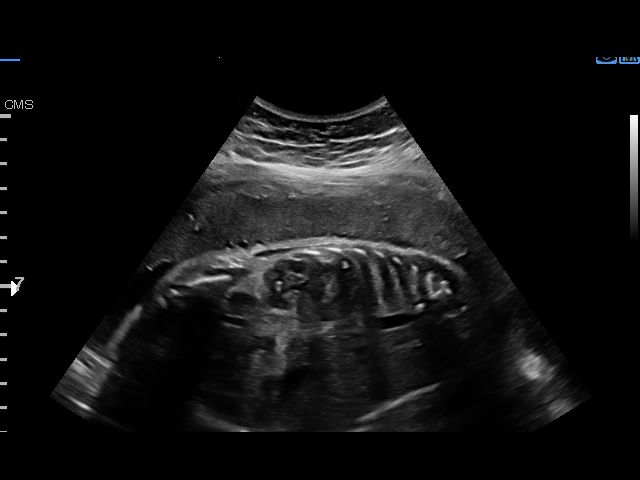
[im 8/66]
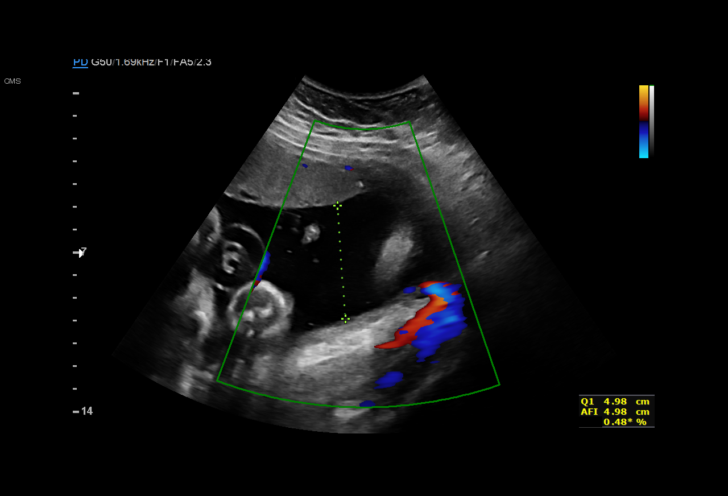
[im 13/66]
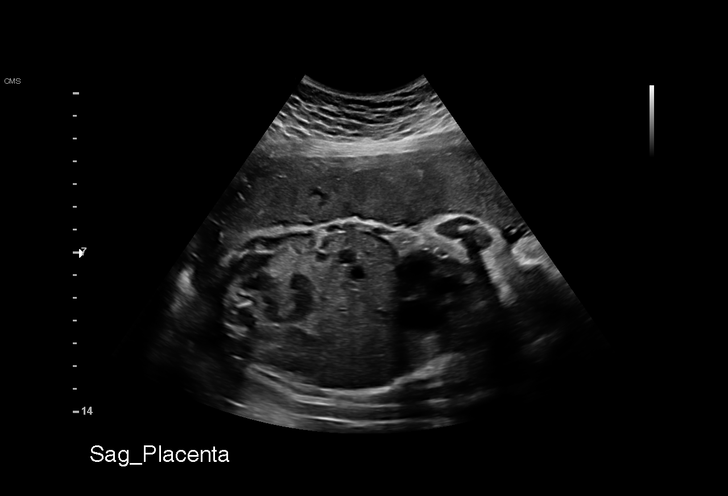
[im 17/66]
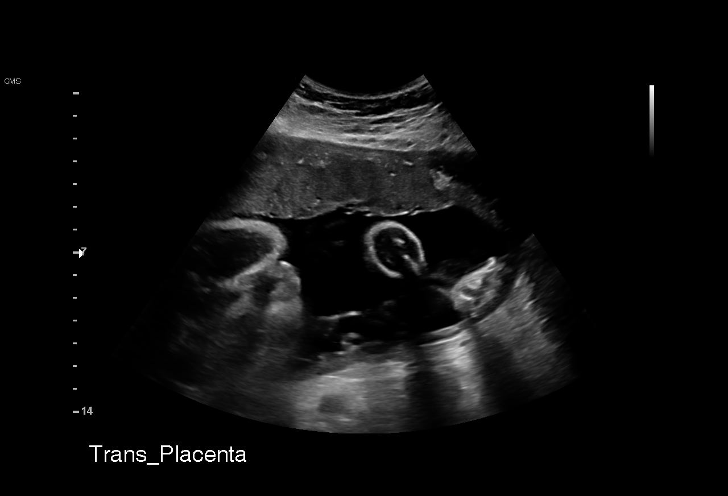
[im 22/66]
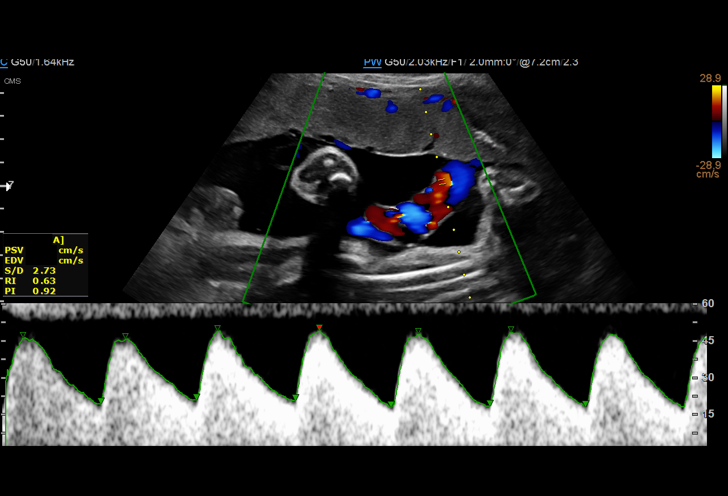
[im 27/66]
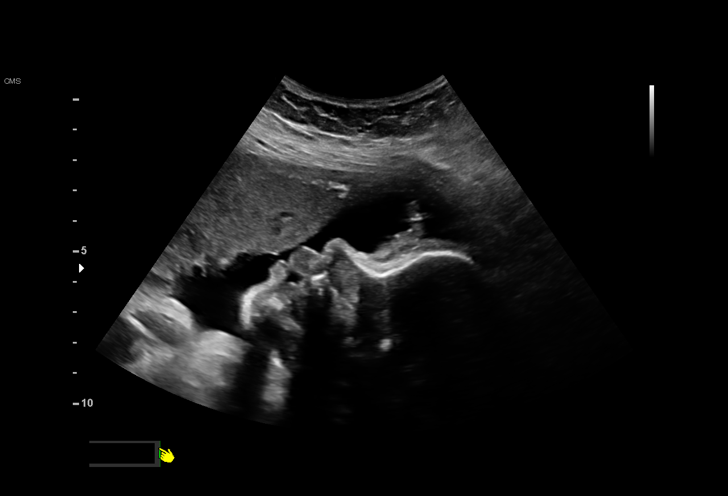
[im 34/66]
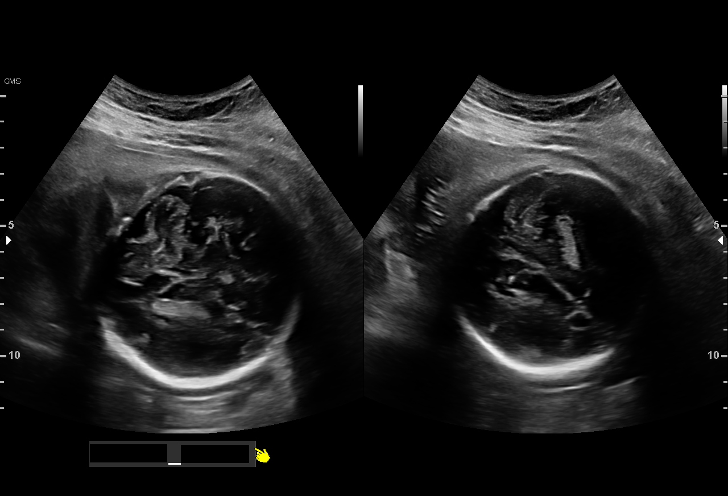
[im 39/66]
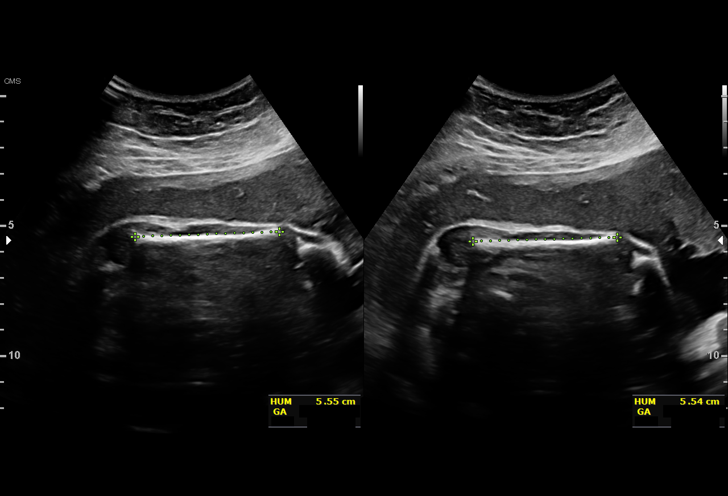
[im 44/66]
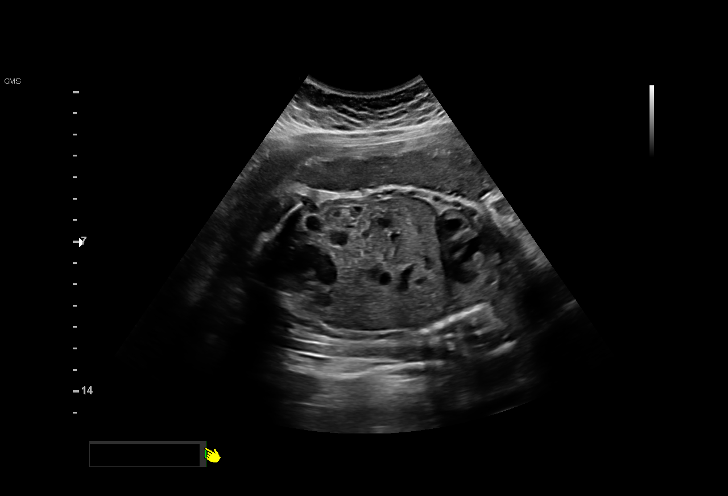
[im 49/66]
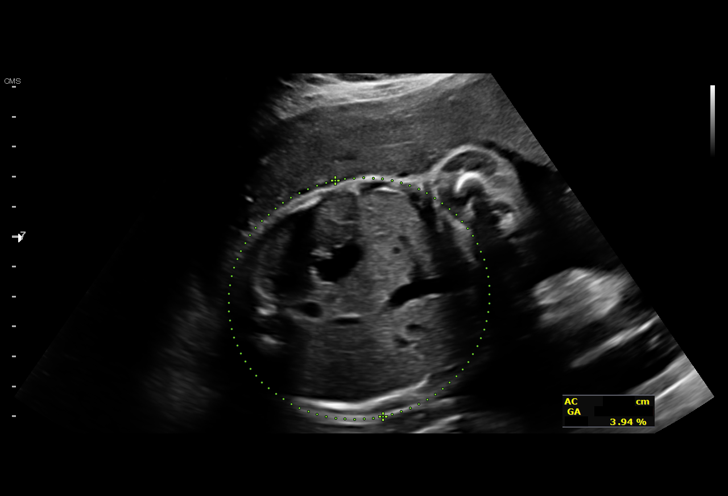
[im 53/66]
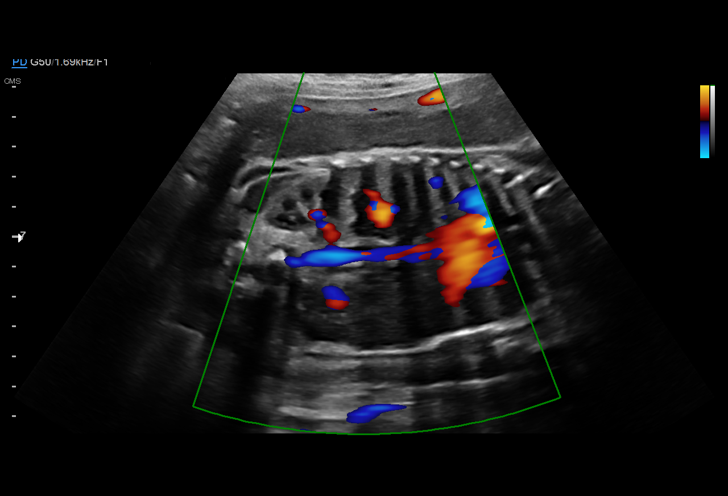
[im 58/66]
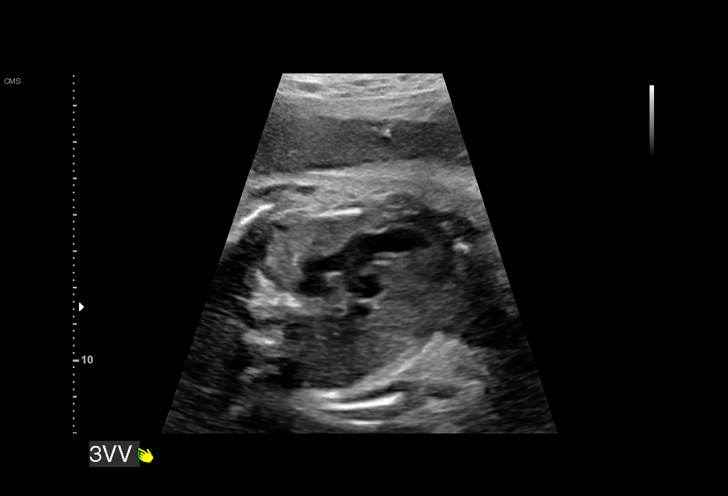
[im 63/66]
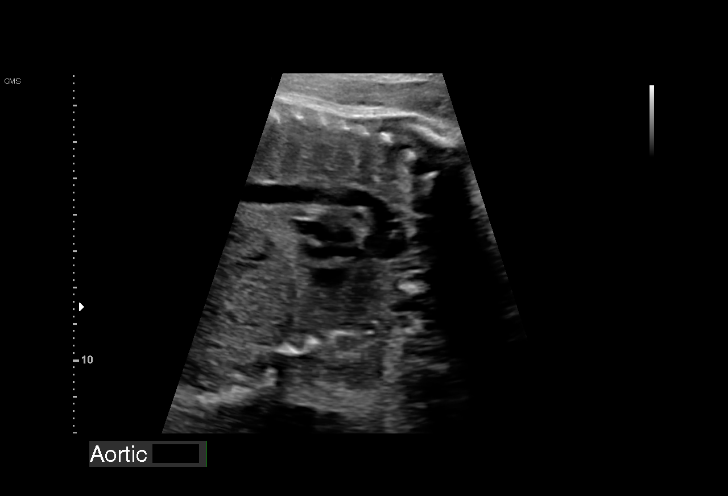

[13 of 28 positions shown; findings below may reference images not displayed]

Indications

 Encounter for other antenatal screening
 follow-up
 Maternal care for known or suspected poor
 fetal growth, third trimester, not applicable or
 unspecified IUGR
 Poor obstetric history: Previous preterm
 delivery, antepartum (33 weeks)
 Low risk NIPS,
 Genetic carrier (silent carrier for Gunn Hilde Valldal)EXP.2
 31 weeks gestation of pregnancy
Fetal Evaluation

 Num Of Fetuses:         1
 Fetal Heart Rate(bpm):  135
 Cardiac Activity:       Observed
 Presentation:           Cephalic
 Placenta:               Anterior
 P. Cord Insertion:      Visualized

 Amniotic Fluid
 AFI FV:      Within normal limits

 AFI Sum(cm)     %Tile       Largest Pocket(cm)
 13.9            46          5.
 RUQ(cm)       RLQ(cm)       LUQ(cm)        LLQ(cm)
 5
Biophysical Evaluation

 Amniotic F.V:   Within normal limits       F. Tone:        Observed
 F. Movement:    Observed                   Score:          [DATE]
 F. Breathing:   Observed
Biometry

 BPD:      79.2  mm     G. Age:  31w 6d         60  %    CI:        74.79   %    70 - 86
                                                         FL/HC:      20.5   %    19.3 -
 HC:      290.6  mm     G. Age:  32w 0d         36  %    HC/AC:      1.09        0.96 -
 AC:       266   mm     G. Age:  30w 5d         34  %    FL/BPD:     75.1   %    71 - 87
 FL:       59.5  mm     G. Age:  31w 0d         32  %    FL/AC:      22.4   %    20 - 24
 HUM:      55.3  mm     G. Age:  32w 1d         73  %

 Est. FW:    3595  gm    3 lb 11 oz      34  %
OB History

 Gravidity:    3         Term:   1        Prem:   1        SAB:   0
 TOP:          0       Ectopic:  0        Living: 2
Gestational Age

 LMP:           32w 5d        Date:  10/03/19                 EDD:   07/09/20
 U/S Today:     31w 3d                                        EDD:   07/18/20
 Best:          31w 1d     Det. By:  U/S  (02/18/20)          EDD:   07/20/20
Anatomy

 Cranium:               Appears normal         Aortic Arch:            Appears normal
 Cavum:                 Appears normal         Ductal Arch:            Previously seen
 Ventricles:            Appears normal         Diaphragm:              Appears normal
 Choroid Plexus:        Appears normal         Stomach:                Appears normal, left
                                                                       sided
 Cerebellum:            Appears normal         Abdomen:                Appears normal
 Posterior Fossa:       Previously seen        Abdominal Wall:         Previously seen
 Nuchal Fold:           Not applicable (>20    Cord Vessels:           Appears normal (3
                        wks GA)                                        vessel cord)
 Face:                  Orbits appear          Kidneys:                Appear normal
                        normal
 Lips:                  Previously seen        Bladder:                Appears normal
 Thoracic:              Appears normal         Spine:                  Previously seen
 Heart:                 Appears normal         Upper Extremities:      Previously seen
                        (4CH, axis, and
                        situs)
 RVOT:                  Appears normal         Lower Extremities:      Previously seen
 LVOT:                  Appears normal

 Other:  Heels and Rt 5th digit previously visualized. Nasal bone visualized.
Doppler - Fetal Vessels

 Umbilical Artery
  S/D     %tile      RI    %tile                             ADFV    RDFV
  3.06       68    0.67       71                                No      No
Cervix Uterus Adnexa

 Cervix
 Not visualized (advanced GA >95wks)

 Uterus
 No abnormality visualized.

 Right Ovary
 Within normal limits.

 Left Ovary
 Within normal limits.

 Cul De Sac
 No free fluid seen.

 Adnexa
 No abnormality visualized.
Impression

 Patient with fetal growth restriction returned for growth
 assessment and antenatal testing.
 I spoke with the patient with help of [employer] interpreter
 (STRATUS). Today, the patient informed that she is unsure of
 her LMP date and has not had ultrasound in the first trimester.

 Although the fetus measured small consistent with growth
 restriction on all her previous scans, good interval growth is
 seen.

 I informed the patient that wrong dating could lead to preterm
 birth. I recommended we amend her EDD based on her first
 ultrasound performed here at 18 weeks.
 We amended her EDD to 07/20/20.

 Based on the amended EDD, the estimated fetal weight is at
 the 34th percentile. Amniotic fluid is normal and good fetal
 activity is seen. Cephalic presentation. Antenatal testing is
 reassuring. BPP [DATE]. Umbilical artery Doppler showed normal
 forward diastolic flow.

 We performed BPP and Doppler studies because of
 suspected fetal growth restriction and the diagnosis was
 revised today based on her history and good interval growth.
Recommendations

 -An appointment was made for her to return in 3 weeks for
 fetal growth assessment.
                 Etibar, Snaiperi

## 2021-12-13 ENCOUNTER — Ambulatory Visit: Payer: Medicaid Other | Admitting: Obstetrics and Gynecology

## 2021-12-22 DIAGNOSIS — Z419 Encounter for procedure for purposes other than remedying health state, unspecified: Secondary | ICD-10-CM | POA: Diagnosis not present

## 2022-01-19 DIAGNOSIS — Z419 Encounter for procedure for purposes other than remedying health state, unspecified: Secondary | ICD-10-CM | POA: Diagnosis not present

## 2022-02-19 DIAGNOSIS — Z419 Encounter for procedure for purposes other than remedying health state, unspecified: Secondary | ICD-10-CM | POA: Diagnosis not present

## 2022-03-21 DIAGNOSIS — Z419 Encounter for procedure for purposes other than remedying health state, unspecified: Secondary | ICD-10-CM | POA: Diagnosis not present

## 2022-04-21 ENCOUNTER — Ambulatory Visit (INDEPENDENT_AMBULATORY_CARE_PROVIDER_SITE_OTHER): Payer: Medicaid Other | Admitting: Obstetrics

## 2022-04-21 ENCOUNTER — Encounter: Payer: Self-pay | Admitting: Obstetrics

## 2022-04-21 VITALS — BP 102/70 | HR 88 | Wt 141.1 lb

## 2022-04-21 DIAGNOSIS — Z3009 Encounter for other general counseling and advice on contraception: Secondary | ICD-10-CM

## 2022-04-21 DIAGNOSIS — Z30432 Encounter for removal of intrauterine contraceptive device: Secondary | ICD-10-CM

## 2022-04-21 DIAGNOSIS — Z419 Encounter for procedure for purposes other than remedying health state, unspecified: Secondary | ICD-10-CM | POA: Diagnosis not present

## 2022-04-21 DIAGNOSIS — Z30013 Encounter for initial prescription of injectable contraceptive: Secondary | ICD-10-CM

## 2022-04-21 DIAGNOSIS — Z3042 Encounter for surveillance of injectable contraceptive: Secondary | ICD-10-CM

## 2022-04-21 LAB — POCT URINE PREGNANCY: Preg Test, Ur: NEGATIVE

## 2022-04-21 MED ORDER — MEDROXYPROGESTERONE ACETATE 150 MG/ML IM SUSP
150.0000 mg | Freq: Once | INTRAMUSCULAR | Status: AC
  Administered 2022-04-21: 150 mg via INTRAMUSCULAR

## 2022-04-21 MED ORDER — MEDROXYPROGESTERONE ACETATE 150 MG/ML IM SUSP: 150.0000 mg | INTRAMUSCULAR | 4 refills | Status: DC

## 2022-04-21 NOTE — Progress Notes (Signed)
    GYNECOLOGY OFFICE PROCEDURE NOTE  [name] is a 26 y.o. [phone] here for Charlo IUD removal. No GYN concerns.  Last pap smear was on 12-27-2019 and was normal.  IUD Removal  Patient identified, informed consent performed, consent signed.  Patient was in the dorsal lithotomy position, normal external genitalia was noted.  A speculum was placed in the patient's vagina, normal discharge was noted, no lesions. The cervix was visualized, no lesions, no abnormal discharge.  The strings of the IUD were grasped and pulled using ring forceps. The IUD was removed in its entirety.  Patient tolerated the procedure well.    Patient will use Depo for contraception.   Routine preventative health maintenance measures emphasized.   Shelly Bombard, MD, [place] for [facility], [place], [place] 04/21/22

## 2022-04-21 NOTE — Addendum Note (Signed)
Addended by: Dalphine Handing on: 04/21/2022 08:50 AM   Modules accepted: Orders

## 2022-04-21 NOTE — Progress Notes (Addendum)
Pt requests to switch from IUD to Depo. UPT negative Office supply Depo given R Del without difficulty.

## 2022-05-21 DIAGNOSIS — Z419 Encounter for procedure for purposes other than remedying health state, unspecified: Secondary | ICD-10-CM | POA: Diagnosis not present

## 2022-06-21 DIAGNOSIS — Z419 Encounter for procedure for purposes other than remedying health state, unspecified: Secondary | ICD-10-CM | POA: Diagnosis not present

## 2022-07-21 ENCOUNTER — Ambulatory Visit: Payer: Medicaid Other | Admitting: Obstetrics

## 2022-07-21 ENCOUNTER — Encounter: Payer: Self-pay | Admitting: Obstetrics and Gynecology

## 2022-07-21 ENCOUNTER — Ambulatory Visit (INDEPENDENT_AMBULATORY_CARE_PROVIDER_SITE_OTHER): Payer: Medicaid Other | Admitting: Obstetrics and Gynecology

## 2022-07-21 VITALS — BP 97/66 | HR 85 | Ht 59.0 in | Wt 141.0 lb

## 2022-07-21 DIAGNOSIS — Z30011 Encounter for initial prescription of contraceptive pills: Secondary | ICD-10-CM | POA: Diagnosis not present

## 2022-07-21 DIAGNOSIS — Z309 Encounter for contraceptive management, unspecified: Secondary | ICD-10-CM | POA: Insufficient documentation

## 2022-07-21 DIAGNOSIS — Z01419 Encounter for gynecological examination (general) (routine) without abnormal findings: Secondary | ICD-10-CM | POA: Diagnosis not present

## 2022-07-21 DIAGNOSIS — Z789 Other specified health status: Secondary | ICD-10-CM | POA: Diagnosis not present

## 2022-07-21 MED ORDER — DESOGESTREL-ETHINYL ESTRADIOL 0.15-30 MG-MCG PO TABS: 1.0000 | ORAL_TABLET | Freq: Every day | ORAL | 11 refills | Status: DC

## 2022-07-21 NOTE — Progress Notes (Signed)
Annual GYN Has been on Depo, prior to that IUD X less than 1 year, prior to that OCP. Wants to switch back to OCP.

## 2022-07-21 NOTE — Progress Notes (Signed)
[name] is a 26 y.o. [phone] female here for a routine annual gynecologic exam.  Current complaints: Desires to switch from Depo to OCP's.   Denies abnormal vaginal bleeding, discharge, pelvic pain, problems with intercourse or other gynecologic concerns.    Gynecologic History No LMP recorded. Patient has had an injection. Contraception: Depo-Provera injections Last Pap: 2021. Results were: normal Last mammogram: NA.   Obstetric History OB History  Gravida Para Term Preterm AB Living  3 3 2 1  0 3  SAB IAB Ectopic Multiple Live Births  0 0 0 0 3    # Outcome Date GA Lbr Len/2nd Weight Sex Delivery Anes PTL Lv  3 Term 06/29/20 [redacted]w[redacted]d 15:36 / 00:39 6 lb 4.2 oz (2.841 kg) M Vag-Spont EPI  LIV  2 Term 11/12/15 [redacted]w[redacted]d 11:30 / 00:11 6 lb 3.8 oz (2.83 kg) M Vag-Spont EPI  LIV     Birth Comments: [place], otherwise WNL  1 Preterm 07/06/11 [redacted]w[redacted]d 42:40 / 01:52 4 lb 5.1 oz (1.96 kg) F Vag-Spont None  LIV    Past Medical History:  Diagnosis Date   Alpha thalassemia silent carrier 01/13/2020   01-13-20  Not discussed as yet with client.  Will need interpreter.  Partner screening suggested. [x]  genetic counseling []  partner testing   Medical history non-contributory    No pertinent past medical history     Past Surgical History:  Procedure Laterality Date   NO PAST SURGERIES      No current outpatient medications on file prior to visit.   No current facility-administered medications on file prior to visit.    No Known Allergies  Social History   Socioeconomic History   Marital status: Married    Spouse name: Not on file   Number of children: Not on file   Years of education: Not on file   Highest education level: Not on file  Occupational History   Not on file  Tobacco Use   Smoking status: Never   Smokeless tobacco: Never  Vaping Use   Vaping Use: Never used  Substance and Sexual Activity   Alcohol use: No   Drug use: No   Sexual activity: Not Currently    Partners: Male     Birth control/protection: Injection  Other Topics Concern   Not on file  Social History Narrative   Not on file   Social Determinants of Health   Financial Resource Strain: Not on file  Food Insecurity: Not on file  Transportation Needs: Not on file  Physical Activity: Not on file  Stress: Not on file  Social Connections: Not on file  Intimate Partner Violence: Not on file    Family History  Problem Relation Age of Onset   Healthy Father    Healthy Mother     The following portions of the patient's history were reviewed and updated as appropriate: allergies, current medications, past family history, past medical history, past social history, past surgical history and problem list.  Review of Systems Pertinent items noted in HPI and remainder of comprehensive ROS otherwise negative.   Objective:  BP 97/66   Pulse 85   Ht 4\' 11"  (1.499 m)   Wt 141 lb (64 kg)   BMI 28.48 kg/m  Chaperone present CONSTITUTIONAL: Well-developed, well-nourished female in no acute distress.  HENT:  Normocephalic, atraumatic, External right and left ear normal. Oropharynx is clear and moist EYES: Conjunctivae and EOM are normal. Pupils are equal, round, and reactive to light. No scleral icterus.  NECK: Normal range of motion, supple, no masses.  Normal thyroid.  SKIN: Skin is warm and dry. No rash noted. Not diaphoretic. No erythema. No pallor. NEUROLGIC: Alert and oriented to person, place, and time. Normal reflexes, muscle tone coordination. No cranial nerve deficit noted. PSYCHIATRIC: Normal mood and affect. Normal behavior. Normal judgment and thought content. CARDIOVASCULAR: Normal heart rate noted, regular rhythm RESPIRATORY: Clear to auscultation bilaterally. Effort and breath sounds normal, no problems with respiration noted. BREASTS: Symmetric in size. No masses, skin changes, nipple drainage, or lymphadenopathy. ABDOMEN: Soft, normal bowel sounds, no distention noted.  No  tenderness, rebound or guarding.  PELVIC: Normal appearing external genitalia; normal appearing vaginal mucosa and cervix.  No abnormal discharge noted.   Normal uterine size, no other palpable masses, no uterine or adnexal tenderness. MUSCULOSKELETAL: Normal range of motion. No tenderness.  No cyanosis, clubbing, or edema.  2+ distal pulses.   Assessment:  Annual gynecologic examination  Contraception management  Plan:  Will start OCP's this Sunday. U/R/B and back up method reviewed with pt. Routine preventative health maintenance measures emphasized. Please refer to After Visit Summary for other counseling recommendations.  Video interrupter used during today's visit  Hermina Staggers, MD, FACOG Attending Obstetrician & Gynecologist Center for [facility], [facility]

## 2022-07-21 NOTE — Patient Instructions (Signed)

## 2022-07-22 DIAGNOSIS — Z419 Encounter for procedure for purposes other than remedying health state, unspecified: Secondary | ICD-10-CM | POA: Diagnosis not present

## 2022-08-21 DIAGNOSIS — Z419 Encounter for procedure for purposes other than remedying health state, unspecified: Secondary | ICD-10-CM | POA: Diagnosis not present

## 2022-09-21 DIAGNOSIS — Z419 Encounter for procedure for purposes other than remedying health state, unspecified: Secondary | ICD-10-CM | POA: Diagnosis not present

## 2022-10-21 DIAGNOSIS — Z419 Encounter for procedure for purposes other than remedying health state, unspecified: Secondary | ICD-10-CM | POA: Diagnosis not present

## 2022-11-02 ENCOUNTER — Encounter: Payer: Self-pay | Admitting: Obstetrics and Gynecology

## 2022-11-02 ENCOUNTER — Ambulatory Visit (INDEPENDENT_AMBULATORY_CARE_PROVIDER_SITE_OTHER): Payer: Medicaid Other | Admitting: Obstetrics and Gynecology

## 2022-11-02 VITALS — BP 106/75 | HR 78 | Wt 141.0 lb

## 2022-11-02 DIAGNOSIS — Z3009 Encounter for other general counseling and advice on contraception: Secondary | ICD-10-CM | POA: Diagnosis not present

## 2022-11-02 DIAGNOSIS — Z789 Other specified health status: Secondary | ICD-10-CM

## 2022-11-02 DIAGNOSIS — Z30013 Encounter for initial prescription of injectable contraceptive: Secondary | ICD-10-CM

## 2022-11-02 LAB — POCT URINE PREGNANCY: Preg Test, Ur: NEGATIVE

## 2022-11-02 MED ORDER — MEDROXYPROGESTERONE ACETATE 150 MG/ML IM SUSP: 150.0000 mg | INTRAMUSCULAR | 3 refills | Status: DC

## 2022-11-02 MED ORDER — MEDROXYPROGESTERONE ACETATE 150 MG/ML IM SUSP
150.0000 mg | Freq: Once | INTRAMUSCULAR | Status: AC
  Administered 2022-11-02: 150 mg via INTRAMUSCULAR

## 2022-11-02 NOTE — Progress Notes (Addendum)
Pt states forgets to take pills daily and would like to switch back to Depo Provera UPT negative today  PE AF VSS Lungs clear Herat RRR Abd soft + BS  A/P Contraception management  Will start Depo Provera today  Video interrupter used during today's visit

## 2022-11-02 NOTE — Addendum Note (Signed)
Addended by: Marya Landry D on: 11/02/2022 10:01 AM   Modules accepted: Orders

## 2022-11-02 NOTE — Progress Notes (Addendum)
Interpreter used for today's visit.  Pt is interested in getting started on Depo. Pt did not take pills regularly, pt still taking pills  LMP 12/2 Last intercourse 12/8   Depo given today per provider order. Pt tolerated well.  Administrations This Visit     medroxyPROGESTERone (DEPO-PROVERA) injection 150 mg     Admin Date 11/02/2022 Action Given Dose 150 mg Route Intramuscular Administered By Lanney Gins, CMA

## 2022-11-02 NOTE — Patient Instructions (Signed)

## 2022-11-02 NOTE — Addendum Note (Signed)
Addended by: Hermina Staggers on: 11/02/2022 10:07 AM   Modules accepted: Orders

## 2022-11-21 DIAGNOSIS — Z419 Encounter for procedure for purposes other than remedying health state, unspecified: Secondary | ICD-10-CM | POA: Diagnosis not present

## 2022-12-22 DIAGNOSIS — Z419 Encounter for procedure for purposes other than remedying health state, unspecified: Secondary | ICD-10-CM | POA: Diagnosis not present

## 2023-01-20 DIAGNOSIS — Z419 Encounter for procedure for purposes other than remedying health state, unspecified: Secondary | ICD-10-CM | POA: Diagnosis not present

## 2023-01-24 ENCOUNTER — Ambulatory Visit: Payer: Medicaid Other

## 2023-02-20 DIAGNOSIS — Z419 Encounter for procedure for purposes other than remedying health state, unspecified: Secondary | ICD-10-CM | POA: Diagnosis not present

## 2023-03-22 DIAGNOSIS — Z419 Encounter for procedure for purposes other than remedying health state, unspecified: Secondary | ICD-10-CM | POA: Diagnosis not present

## 2023-04-22 DIAGNOSIS — Z419 Encounter for procedure for purposes other than remedying health state, unspecified: Secondary | ICD-10-CM | POA: Diagnosis not present

## 2023-05-22 DIAGNOSIS — Z419 Encounter for procedure for purposes other than remedying health state, unspecified: Secondary | ICD-10-CM | POA: Diagnosis not present

## 2023-06-22 DIAGNOSIS — Z419 Encounter for procedure for purposes other than remedying health state, unspecified: Secondary | ICD-10-CM | POA: Diagnosis not present

## 2023-07-17 ENCOUNTER — Ambulatory Visit (INDEPENDENT_AMBULATORY_CARE_PROVIDER_SITE_OTHER): Payer: Medicaid Other | Admitting: Emergency Medicine

## 2023-07-17 VITALS — BP 115/78 | HR 90 | Wt 144.7 lb

## 2023-07-17 DIAGNOSIS — Z3201 Encounter for pregnancy test, result positive: Secondary | ICD-10-CM

## 2023-07-17 DIAGNOSIS — N912 Amenorrhea, unspecified: Secondary | ICD-10-CM | POA: Diagnosis not present

## 2023-07-17 LAB — POCT URINE PREGNANCY: Preg Test, Ur: POSITIVE — AB

## 2023-07-17 MED ORDER — GOODSENSE PRENATAL VITAMINS 28-0.8 MG PO TABS: 1.0000 | ORAL_TABLET | Freq: Every day | ORAL | 11 refills | Status: DC

## 2023-07-17 NOTE — Progress Notes (Signed)
Ms. Radden presents today for UPT. She has no unusual complaints and has occasional n/v. LMP: 05/24/2023    OBJECTIVE: Appears well, in no apparent distress.  OB History     Gravida  4   Para  3   Term  2   Preterm  1   AB  0   Living  3      SAB  0   IAB  0   Ectopic  0   Multiple  0   Live Births  3          Home UPT Result: Positive In-Office UPT result: Positive I have reviewed the patient's medical, obstetrical, social, and family histories, and medications.   ASSESSMENT: Positive pregnancy test  PLAN Prenatal care to be completed at: [facility], PNV Rx sent to pharmacy.

## 2023-07-23 DIAGNOSIS — Z419 Encounter for procedure for purposes other than remedying health state, unspecified: Secondary | ICD-10-CM | POA: Diagnosis not present

## 2023-07-30 ENCOUNTER — Other Ambulatory Visit: Payer: Self-pay | Admitting: Obstetrics and Gynecology

## 2023-07-30 DIAGNOSIS — Z30011 Encounter for initial prescription of contraceptive pills: Secondary | ICD-10-CM

## 2023-08-11 ENCOUNTER — Other Ambulatory Visit (HOSPITAL_COMMUNITY)
Admission: RE | Admit: 2023-08-11 | Discharge: 2023-08-11 | Disposition: A | Discharge status: Home / Self Care | Payer: Medicaid Other | Source: Ambulatory Visit | Attending: Obstetrics and Gynecology | Admitting: Obstetrics and Gynecology

## 2023-08-11 ENCOUNTER — Other Ambulatory Visit (INDEPENDENT_AMBULATORY_CARE_PROVIDER_SITE_OTHER): Payer: Medicaid Other

## 2023-08-11 ENCOUNTER — Ambulatory Visit (INDEPENDENT_AMBULATORY_CARE_PROVIDER_SITE_OTHER): Payer: Medicaid Other | Admitting: *Deleted

## 2023-08-11 VITALS — BP 110/74 | HR 76 | Wt 144.6 lb

## 2023-08-11 DIAGNOSIS — O0991 Supervision of high risk pregnancy, unspecified, first trimester: Secondary | ICD-10-CM

## 2023-08-11 DIAGNOSIS — O0990 Supervision of high risk pregnancy, unspecified, unspecified trimester: Secondary | ICD-10-CM | POA: Insufficient documentation

## 2023-08-11 DIAGNOSIS — Z1339 Encounter for screening examination for other mental health and behavioral disorders: Secondary | ICD-10-CM | POA: Diagnosis not present

## 2023-08-11 DIAGNOSIS — Z3481 Encounter for supervision of other normal pregnancy, first trimester: Secondary | ICD-10-CM

## 2023-08-11 DIAGNOSIS — Z3A08 8 weeks gestation of pregnancy: Secondary | ICD-10-CM

## 2023-08-11 NOTE — Addendum Note (Signed)
Addended by: Maretta Bees on: 08/11/2023 12:36 PM   Modules accepted: Orders

## 2023-08-11 NOTE — Patient Instructions (Signed)
The [facility] for [employer] has a partnership with the [employer] to provide prenatal navigation for the most needed resources in our community. In order to see how we can help connect you to these resources we need consent to contact you. Please complete the very short consent using the link below:   English Link: https://[place].tfaforms.net/283?site=16  Spanish Link: https://[place].tfaforms.net/287?site=16   Options for Doula Care in the [place] Area  As you review your birthing options, consider having a birth doula. A doula is trained to provide support before, during and just after you give birth. There are also postpartum doulas that help you adjust to new parenthood.  While doulas do not provide medical care, they do provide emotional, physical and educational support. A few months before your baby arrives, doulas can help answer questions, ease concerns and help you create and support your birthing plan.    Doulas can help reduce your stress and comfort you and your partner. They can help you cope with labor by helping you use breathing techniques, massage, creative labor positioning, essential oils and affirmations.   Studies show that the benefits of having a doula include:   A more positive birth experience  Fewer requests for pain-relief medication  Less likelihood of cesarean section, commonly called a c-section   Doulas are typically hired via a [employer] account planner between you and the doula. We are happy to provide a list of the most active doulas in the area, all of whom are credentialed by [facility] and will not count as a visitor at your birth.  There are several options for no-cost doula care at our hospital, including:  [facility] Volunteer Doula Program Every [employer] Program A Cure 4 Moms Doula Study (available only at [employer] for Women, [place], [place] and [employer] [facility] offices)  For more information on these programs or to receive  a list of doulas active in our area, please email [email or web address]

## 2023-08-11 NOTE — Progress Notes (Signed)
New OB Intake  Electronic Zada Finders used for entire [employer] and [place] visit.  I connected with [name]  on 08/11/23 at  8:15 AM EDT by In Person Visit and verified that I am speaking with the correct person using two identifiers. Nurse is located at [facility]  and pt is located at [place].  I discussed the limitations, risks, security and privacy concerns of performing an evaluation and management service by telephone and the availability of in person appointments. I also discussed with the patient that there may be a patient responsible charge related to this service. The patient expressed understanding and agreed to proceed.  I explained I am completing New OB Intake today. We discussed EDD of 02/28/2024, Date entered prior to episode creation. Pt is [id number]. I reviewed her allergies, medications and Medical/Surgical/OB history.    Patient Active Problem List   Diagnosis Date Noted   Visit for routine gyn exam 07/21/2022   Contraception management 07/21/2022   Language barrier 02/21/2019    Concerns addressed today  Delivery Plans Plans to deliver at [facility] Discussed the nature of our practice with multiple providers including residents and students. Due to the size of the practice, the delivering provider may not be the same as those providing prenatal care.   Patient is not interested in water birth. Offered upcoming OB visit with CNM to discuss further.  MyChart/Babyscripts MyChart access verified. I explained pt will have some visits in office and some virtually. Babyscripts instructions given and order placed. Patient verifies receipt of registration text/e-mail. Account successfully created and app downloaded.  Blood Pressure Cuff/Weight Scale Blood pressure cuff ordered for patient to pick-up from [employer]. Explained after first prenatal appt pt will check weekly and document in Babyscripts. Patient does not have weight scale; patient may purchase if they desire to  track weight weekly in Babyscripts.  Anatomy US Explained first scheduled [place] will be around 19 weeks. Anatomy US scheduled for TBD at TBD.  Interested in [place]? If yes, send referral and doula dot phrase.   Is patient a candidate for Babyscripts Optimization? No - high risk, language barrier  First visit review I reviewed new OB appt with patient. Explained pt will be seen by Dr. Para March at first visit. Discussed Avelina Laine genetic screening with patient. Requests Panorama. Routine prenatal labs  collected at today's visit    Last Pap Diagnosis  Date Value Ref Range Status  12/27/2019   Final   - Negative for intraepithelial lesion or malignancy (NILM)    Harrel Lemon, RN 08/11/2023  8:17 AM

## 2023-08-12 LAB — COMPREHENSIVE METABOLIC PANEL
ALT: 18 IU/L (ref 0–32)
AST: 17 IU/L (ref 0–40)
Albumin: 4 g/dL (ref 4.0–5.0)
Alkaline Phosphatase: 68 IU/L (ref 44–121)
BUN/Creatinine Ratio: 12 (ref 9–23)
BUN: 7 mg/dL (ref 6–20)
Bilirubin Total: <0.2 mg/dL (ref 0.0–1.2)
CO2: 21 mmol/L (ref 20–29)
Calcium: 9.4 mg/dL (ref 8.7–10.2)
Chloride: 102 mmol/L (ref 96–106)
Creatinine, Ser: 0.6 mg/dL (ref 0.57–1.00)
Globulin, Total: 2.8 g/dL (ref 1.5–4.5)
Glucose: 77 mg/dL (ref 70–99)
Potassium: 4.5 mmol/L (ref 3.5–5.2)
Sodium: 139 mmol/L (ref 134–144)
Total Protein: 6.8 g/dL (ref 6.0–8.5)
eGFR: 125 mL/min/{1.73_m2} (ref >59)

## 2023-08-12 LAB — CBC/D/PLT+RPR+RH+ABO+RUBIGG...
Antibody Screen: NEGATIVE
Basophils Absolute: 0 10*3/uL (ref 0.0–0.2)
Basos: 0 %
EOS (ABSOLUTE): 0.2 10*3/uL (ref 0.0–0.4)
Eos: 3 %
HCV Ab: NONREACTIVE
HIV Screen 4th Generation wRfx: NONREACTIVE
Hematocrit: 37.4 % (ref 34.0–46.6)
Hemoglobin: 11 g/dL — ABNORMAL LOW (ref 11.1–15.9)
Hepatitis B Surface Ag: NEGATIVE
Immature Grans (Abs): 0 10*3/uL (ref 0.0–0.1)
Immature Granulocytes: 0 %
Lymphocytes Absolute: 1.7 10*3/uL (ref 0.7–3.1)
Lymphs: 23 %
MCH: 21.7 pg — ABNORMAL LOW (ref 26.6–33.0)
MCHC: 29.4 g/dL — ABNORMAL LOW (ref 31.5–35.7)
MCV: 74 fL — ABNORMAL LOW (ref 79–97)
Monocytes Absolute: 0.5 10*3/uL (ref 0.1–0.9)
Monocytes: 6 %
Neutrophils Absolute: 4.8 10*3/uL (ref 1.4–7.0)
Neutrophils: 68 %
Platelets: 408 10*3/uL (ref 150–450)
RBC: 5.08 x10E6/uL (ref 3.77–5.28)
RDW: 17.3 % — ABNORMAL HIGH (ref 11.7–15.4)
RPR Ser Ql: NONREACTIVE
Rh Factor: POSITIVE
Rubella Antibodies, IGG: 4.78 index (ref >0.99)
WBC: 7.2 10*3/uL (ref 3.4–10.8)

## 2023-08-12 LAB — HCV INTERPRETATION

## 2023-08-13 LAB — PANORAMA PRENATAL TEST FULL PANEL:PANORAMA TEST PLUS 5 ADDITIONAL MICRODELETIONS

## 2023-08-13 LAB — CULTURE, OB URINE

## 2023-08-13 LAB — URINE CULTURE, OB REFLEX

## 2023-08-14 LAB — CERVICOVAGINAL ANCILLARY ONLY
Bacterial Vaginitis (gardnerella): NEGATIVE
Candida Glabrata: NEGATIVE
Candida Vaginitis: NEGATIVE
Chlamydia: NEGATIVE
Comment: NEGATIVE
Comment: NEGATIVE
Comment: NEGATIVE
Comment: NEGATIVE
Comment: NEGATIVE
Comment: NORMAL
Neisseria Gonorrhea: NEGATIVE
Trichomonas: NEGATIVE

## 2023-08-22 DIAGNOSIS — Z419 Encounter for procedure for purposes other than remedying health state, unspecified: Secondary | ICD-10-CM | POA: Diagnosis not present

## 2023-09-06 ENCOUNTER — Encounter: Payer: Self-pay | Admitting: Obstetrics and Gynecology

## 2023-09-06 ENCOUNTER — Ambulatory Visit (INDEPENDENT_AMBULATORY_CARE_PROVIDER_SITE_OTHER): Payer: Medicaid Other | Admitting: Obstetrics and Gynecology

## 2023-09-06 ENCOUNTER — Other Ambulatory Visit (HOSPITAL_COMMUNITY)
Admission: RE | Admit: 2023-09-06 | Discharge: 2023-09-06 | Disposition: A | Discharge status: Home / Self Care | Payer: Medicaid Other | Source: Ambulatory Visit | Attending: Obstetrics and Gynecology | Admitting: Obstetrics and Gynecology

## 2023-09-06 VITALS — BP 104/68 | HR 83 | Wt 146.4 lb

## 2023-09-06 DIAGNOSIS — O0990 Supervision of high risk pregnancy, unspecified, unspecified trimester: Secondary | ICD-10-CM | POA: Insufficient documentation

## 2023-09-06 DIAGNOSIS — Z3A12 12 weeks gestation of pregnancy: Secondary | ICD-10-CM

## 2023-09-06 DIAGNOSIS — Z3481 Encounter for supervision of other normal pregnancy, first trimester: Secondary | ICD-10-CM

## 2023-09-06 DIAGNOSIS — Z603 Acculturation difficulty: Secondary | ICD-10-CM | POA: Diagnosis not present

## 2023-09-06 DIAGNOSIS — O0991 Supervision of high risk pregnancy, unspecified, first trimester: Secondary | ICD-10-CM | POA: Diagnosis not present

## 2023-09-06 DIAGNOSIS — D563 Thalassemia minor: Secondary | ICD-10-CM

## 2023-09-06 DIAGNOSIS — Z8751 Personal history of pre-term labor: Secondary | ICD-10-CM

## 2023-09-06 DIAGNOSIS — Z758 Other problems related to medical facilities and other health care: Secondary | ICD-10-CM

## 2023-09-06 NOTE — Progress Notes (Signed)
History:   [name] is a 27 y.o. [phone] at 109w1d by early ultrasound being seen today for her first obstetrical visit.   Patient does intend to breast feed.   Pregnancy history fully reviewed. Obstetrical history is significant for one PT SVD at 33w with two subsequent SVD.   Patient reports no complaints.      HISTORY: OB History  Gravida Para Term Preterm AB Living  4 3 2 1  0 3  SAB IAB Ectopic Multiple Live Births  0 0 0 0 3    # Outcome Date GA Lbr Len/2nd Weight Sex Type Anes PTL Lv  4 Current           3 Term 06/29/20 [redacted]w[redacted]d 15:36 / 00:39 6 lb 4.2 oz (2.841 kg) M Vag-Spont EPI  LIV     Name: [name]     Apgar1: 9  Apgar5: 9  2 Term 11/12/15 [redacted]w[redacted]d 11:30 / 00:11 6 lb 3.8 oz (2.83 kg) M Vag-Spont EPI  LIV     Birth Comments: [place], otherwise WNL     Name: [name]     Apgar1: 9  Apgar5: 10  1 Preterm 07/06/11 [redacted]w[redacted]d 42:40 / 01:52 4 lb 5.1 oz (1.96 kg) F Vag-Spont None  LIV     Name: [name]    Lab Results  Component Value Date   DIAGPAP  12/27/2019    - Negative for intraepithelial lesion or malignancy (NILM)   DIAGPAP  02/09/2017    NEGATIVE FOR INTRAEPITHELIAL LESIONS OR MALIGNANCY.     Past Medical History:  Diagnosis Date   Alpha thalassemia silent carrier 01/13/2020   01-13-20  Not discussed as yet with client.  Will need interpreter.  Partner screening suggested. [x]  genetic counseling []  partner testing   Medical history non-contributory    No pertinent past medical history    Past Surgical History:  Procedure Laterality Date   NO PAST SURGERIES     Family History  Problem Relation Age of Onset   Healthy Father    Healthy Mother    Social History   Tobacco Use   Smoking status: Never   Smokeless tobacco: Never  Vaping Use   Vaping status: Never Used  Substance Use Topics   Alcohol use: No   Drug use: Never   No Known Allergies Current Outpatient Medications on File Prior to Visit  Medication Sig Dispense Refill   Prenatal Vit-Fe  Fumarate-FA (GOODSENSE PRENATAL VITAMINS) 28-0.8 MG TABS Take 1 tablet by mouth daily. 30 tablet 11   ISIBLOOM 0.15-30 MG-MCG tablet TAKE 1 TABLET BY MOUTH DAILY (Patient not taking: Reported on 08/11/2023) 28 tablet 11   medroxyPROGESTERone (DEPO-PROVERA) 150 MG/ML injection Inject 1 mL (150 mg total) into the muscle every 3 (three) months. (Patient not taking: Reported on 07/17/2023) 1 mL 3   No current facility-administered medications on file prior to visit.    Review of Systems Pertinent items noted in HPI and remainder of comprehensive ROS otherwise negative.  Physical Exam:   Vitals:   09/06/23 0954  BP: 104/68  Pulse: 83  Weight: 146 lb 6.4 oz (66.4 kg)   Fetal Heart Rate (bpm): 157  General: well-developed, well-nourished female in no acute distress  Breasts:  normal appearance, no masses or tenderness bilaterally  Skin: normal coloration and turgor, no rashes  Neurologic: oriented, normal, negative, normal mood  Extremities: normal strength, tone, and muscle mass, ROM of all joints is normal  HEENT PERRLA, extraocular movement intact and  sclera clear, anicteric  Neck supple and no masses  Cardiovascular: regular rate and rhythm  Respiratory:  no respiratory distress, normal breath sounds  Abdomen: soft, non-tender; bowel sounds normal; no masses,  no organomegaly  Pelvic: normal external genitalia, no lesions, normal vaginal mucosa, normal vaginal discharge, normal cervix, pap smear done. Uterine size:  aga    Assessment:    Pregnancy: [id number] Patient Active Problem List   Diagnosis Date Noted   Supervision of high risk pregnancy, antepartum 08/11/2023   Language barrier 02/21/2019   History of preterm delivery      Plan:    1. Supervision of high risk pregnancy, antepartum Initial labs already done. Continue prenatal vitamins. Problem list reviewed and updated. Genetic Screening discussed, First trimester screen, Quad screen, and NIPS: Initial Panorama was too  soon. Can redraw today. Marland Kitchen Ultrasound discussed; fetal anatomic survey: scheduled. . Anticipatory guidance about prenatal visits given including labs, ultrasounds, and testing. Discussed usage of Babyscripts and virtual visits  2. Language barrier [name] Runner, broadcasting/film/video used.   3. Pregnancy with 12 completed weeks gestation   4. History of preterm delivery No intervention needed. Will do CL     The nature of [place] [facility] for [facility] Healthcare/Faculty Practice with multiple MDs and Advanced Practice Providers was explained to patient; also emphasized that residents, students are part of our team. Routine obstetric precautions reviewed. Encouraged to seek out care at office or emergency room [facility] preferred) for urgent and/or emergent concerns. No follow-ups on file.    Milas Hock, MD, FACOG Obstetrician & Gynecologist, [facility] for [facility], [facility]

## 2023-09-07 LAB — CYTOLOGY - PAP
Chlamydia: NEGATIVE
Comment: NEGATIVE
Comment: NORMAL
Diagnosis: NEGATIVE
Neisseria Gonorrhea: NEGATIVE

## 2023-09-07 LAB — HEMOGLOBIN A1C
Est. average glucose Bld gHb Est-mCnc: 120 mg/dL
Hgb A1c MFr Bld: 5.8 % — ABNORMAL HIGH (ref 4.8–5.6)

## 2023-09-11 LAB — PANORAMA PRENATAL TEST FULL PANEL:PANORAMA TEST PLUS 5 ADDITIONAL MICRODELETIONS: FETAL FRACTION: 5.6

## 2023-09-15 ENCOUNTER — Other Ambulatory Visit: Payer: Medicaid Other

## 2023-09-15 DIAGNOSIS — R7309 Other abnormal glucose: Secondary | ICD-10-CM

## 2023-09-16 LAB — GLUCOSE TOLERANCE, 2 HOURS W/ 1HR
Glucose, 1 hour: 131 mg/dL (ref 70–179)
Glucose, 2 hour: 102 mg/dL (ref 70–152)
Glucose, Fasting: 78 mg/dL (ref 70–91)

## 2023-09-22 DIAGNOSIS — Z419 Encounter for procedure for purposes other than remedying health state, unspecified: Secondary | ICD-10-CM | POA: Diagnosis not present

## 2023-09-23 ENCOUNTER — Inpatient Hospital Stay (EMERGENCY_DEPARTMENT_HOSPITAL)
Admission: AD | Admit: 2023-09-23 | Discharge: 2023-09-23 | Disposition: A | Discharge status: Home / Self Care | Payer: Medicaid Other | Source: Home / Self Care | Attending: Obstetrics and Gynecology | Admitting: Obstetrics and Gynecology

## 2023-09-23 ENCOUNTER — Encounter (HOSPITAL_COMMUNITY): Payer: Self-pay | Admitting: Obstetrics and Gynecology

## 2023-09-23 ENCOUNTER — Other Ambulatory Visit: Payer: Self-pay

## 2023-09-23 DIAGNOSIS — Z148 Genetic carrier of other disease: Secondary | ICD-10-CM | POA: Diagnosis not present

## 2023-09-23 DIAGNOSIS — O09212 Supervision of pregnancy with history of pre-term labor, second trimester: Secondary | ICD-10-CM | POA: Diagnosis not present

## 2023-09-23 DIAGNOSIS — O09522 Supervision of elderly multigravida, second trimester: Secondary | ICD-10-CM | POA: Diagnosis not present

## 2023-09-23 DIAGNOSIS — R1032 Left lower quadrant pain: Secondary | ICD-10-CM | POA: Diagnosis not present

## 2023-09-23 DIAGNOSIS — O4692 Antepartum hemorrhage, unspecified, second trimester: Secondary | ICD-10-CM | POA: Diagnosis not present

## 2023-09-23 DIAGNOSIS — Z3A14 14 weeks gestation of pregnancy: Secondary | ICD-10-CM | POA: Insufficient documentation

## 2023-09-23 DIAGNOSIS — Z23 Encounter for immunization: Secondary | ICD-10-CM | POA: Diagnosis not present

## 2023-09-23 DIAGNOSIS — O209 Hemorrhage in early pregnancy, unspecified: Secondary | ICD-10-CM | POA: Insufficient documentation

## 2023-09-23 NOTE — MAU Note (Signed)
[name] is a 26 y.o. at [redacted]w[redacted]d here in MAU reporting: she sneezed at around 1800 this afternoon she had some incontinence and also noticed a small amount of blood on underwear.  Denies pain. RN shown picture of VB, bleeding small and light red/pink. LMP: None Onset of complaint: today Pain score: 0 Vitals:   09/23/23 1850  BP: 101/67  Pulse: 86  Resp: 18  Temp: 97.6 F (36.4 C)  SpO2: 100%     FHT:156 bpm Lab orders placed from triage:   None

## 2023-09-23 NOTE — MAU Provider Note (Signed)
History     CSN: [id number]  Arrival date and time: 09/23/23 8657   Event Date/Time   First Provider Initiated Contact with Patient 09/23/23 1903      Chief Complaint  Patient presents with   Vaginal Bleeding   HPI [name] is a 27 y.o. [id number] at [redacted]w[redacted]d who presents with vaginal bleeding. States after sneezing this evening, she wiped & saw a quarter sized amount of pink blood on toilet paper. Not bleeding into a pad or passing clots. Denies abdominal pain, vaginal discharge, vaginal irritation, or recent intercourse.   OB History     Gravida  4   Para  3   Term  2   Preterm  1   AB  0   Living  3      SAB  0   IAB  0   Ectopic  0   Multiple  0   Live Births  3           Past Medical History:  Diagnosis Date   Alpha thalassemia silent carrier 01/13/2020    Past Surgical History:  Procedure Laterality Date   NO PAST SURGERIES      Family History  Problem Relation Age of Onset   Healthy Father    Healthy Mother     Social History   Tobacco Use   Smoking status: Never   Smokeless tobacco: Never  Vaping Use   Vaping status: Never Used  Substance Use Topics   Alcohol use: No   Drug use: Never    Allergies: No Known Allergies  No medications prior to admission.    Review of Systems  All other systems reviewed and are negative.  Physical Exam   Blood pressure 101/67, pulse 86, temperature 97.6 F (36.4 C), temperature source Oral, resp. rate 18, weight 65.9 kg, last menstrual period 05/24/2023, SpO2 100%.  Physical Exam Vitals and nursing note reviewed.  Constitutional:      General: She is not in acute distress.    Appearance: She is well-developed. She is not ill-appearing.  HENT:     Head: Normocephalic and atraumatic.  Eyes:     General: No scleral icterus.       Right eye: No discharge.        Left eye: No discharge.     Conjunctiva/sclera: Conjunctivae normal.  Pulmonary:     Effort: Pulmonary effort is normal. No  respiratory distress.  Abdominal:     Palpations: Abdomen is soft.     Tenderness: There is no abdominal tenderness.  Genitourinary:    Comments: Cervix closed/thick No blood.  Neurological:     General: No focal deficit present.     Mental Status: She is alert.  Psychiatric:        Mood and Affect: Mood normal.        Behavior: Behavior normal.     Pt informed that the ultrasound is considered a limited OB ultrasound and is not intended to be a complete ultrasound exam.  Patient also informed that the ultrasound is not being completed with the intent of assessing for fetal or placental anomalies or any pelvic abnormalities.  Explained that the purpose of today's ultrasound is to assess for   patient reassurance .  Patient acknowledges the purpose of the exam and the limitations of the study.   Active fetus in breech position with +FCA. Posterior placenta.   MAU Course  Procedures No results found for this or any previous visit (from  the past 24 hour(s)).  MDM FHT present BSUS for patient reassurance Pelvic exam  Assessment and Plan   1. Vaginal bleeding in pregnancy, second trimester   2. [redacted] weeks gestation of pregnancy    -RH positive -No blood on exam & cervix closed. Vaginal swabs recently negative - did not recollect -Patient reassured. Reviewed bleeding precautions & reasons to return.   Phone [name] interpreter used for this encounter.   Judeth Horn 09/23/2023, 8:22 PM

## 2023-09-23 NOTE — Progress Notes (Signed)
Terri Spire, NP into see pt.  Bedside ultrasound performed. Fetal movement and cardiac activity noted.

## 2023-09-24 ENCOUNTER — Other Ambulatory Visit: Payer: Self-pay

## 2023-09-24 ENCOUNTER — Inpatient Hospital Stay (HOSPITAL_COMMUNITY): Payer: Medicaid Other

## 2023-09-24 ENCOUNTER — Encounter (HOSPITAL_COMMUNITY): Payer: Self-pay | Admitting: Obstetrics and Gynecology

## 2023-09-24 ENCOUNTER — Observation Stay (HOSPITAL_COMMUNITY)
Admission: AD | Admit: 2023-09-24 | Discharge: 2023-09-25 | Disposition: A | Discharge status: Home / Self Care | Payer: Medicaid Other | Attending: Obstetrics and Gynecology | Admitting: Obstetrics and Gynecology

## 2023-09-24 DIAGNOSIS — O26892 Other specified pregnancy related conditions, second trimester: Secondary | ICD-10-CM | POA: Diagnosis not present

## 2023-09-24 DIAGNOSIS — O99012 Anemia complicating pregnancy, second trimester: Secondary | ICD-10-CM | POA: Diagnosis present

## 2023-09-24 DIAGNOSIS — R1032 Left lower quadrant pain: Secondary | ICD-10-CM | POA: Diagnosis present

## 2023-09-24 DIAGNOSIS — R102 Pelvic and perineal pain: Secondary | ICD-10-CM | POA: Diagnosis not present

## 2023-09-24 DIAGNOSIS — O09522 Supervision of elderly multigravida, second trimester: Secondary | ICD-10-CM | POA: Insufficient documentation

## 2023-09-24 DIAGNOSIS — Z3A14 14 weeks gestation of pregnancy: Secondary | ICD-10-CM | POA: Diagnosis not present

## 2023-09-24 DIAGNOSIS — Z23 Encounter for immunization: Secondary | ICD-10-CM

## 2023-09-24 DIAGNOSIS — O4692 Antepartum hemorrhage, unspecified, second trimester: Secondary | ICD-10-CM

## 2023-09-24 DIAGNOSIS — Z148 Genetic carrier of other disease: Secondary | ICD-10-CM

## 2023-09-24 DIAGNOSIS — O09212 Supervision of pregnancy with history of pre-term labor, second trimester: Secondary | ICD-10-CM

## 2023-09-24 DIAGNOSIS — O209 Hemorrhage in early pregnancy, unspecified: Principal | ICD-10-CM | POA: Diagnosis present

## 2023-09-24 LAB — CBC
HCT: 30.7 % — ABNORMAL LOW (ref 36.0–46.0)
HCT: 27.9 % — ABNORMAL LOW (ref 36.0–46.0)
Hemoglobin: 9.2 g/dL — ABNORMAL LOW (ref 12.0–15.0)
Hemoglobin: 8.5 g/dL — ABNORMAL LOW (ref 12.0–15.0)
MCH: 21.3 pg — ABNORMAL LOW (ref 26.0–34.0)
MCH: 21.7 pg — ABNORMAL LOW (ref 26.0–34.0)
MCHC: 30 g/dL (ref 30.0–36.0)
MCHC: 30.5 g/dL (ref 30.0–36.0)
MCV: 71.2 fL — ABNORMAL LOW (ref 80.0–100.0)
MCV: 71.2 fL — ABNORMAL LOW (ref 80.0–100.0)
Platelets: 346 10*3/uL (ref 150–400)
Platelets: 320 10*3/uL (ref 150–400)
RBC: 4.31 MIL/uL (ref 3.87–5.11)
RBC: 3.92 MIL/uL (ref 3.87–5.11)
RDW: 16.1 % — ABNORMAL HIGH (ref 11.5–15.5)
RDW: 16.1 % — ABNORMAL HIGH (ref 11.5–15.5)
WBC: 7.9 10*3/uL (ref 4.0–10.5)
WBC: 9.3 10*3/uL (ref 4.0–10.5)
nRBC: 0 % (ref 0.0–0.2)
nRBC: 0 % (ref 0.0–0.2)

## 2023-09-24 LAB — WET PREP, GENITAL
Clue Cells Wet Prep HPF POC: NONE SEEN
Sperm: NONE SEEN
Trich, Wet Prep: NONE SEEN
WBC, Wet Prep HPF POC: >=10 — AB (ref <10)
Yeast Wet Prep HPF POC: NONE SEEN

## 2023-09-24 LAB — FOLATE: Folate: 14.1 ng/mL (ref >5.9)

## 2023-09-24 LAB — IRON AND TIBC
Iron: 32 ug/dL (ref 28–170)
Saturation Ratios: 5 % — ABNORMAL LOW (ref 10.4–31.8)
TIBC: 680 ug/dL — ABNORMAL HIGH (ref 250–450)
UIBC: 648 ug/dL

## 2023-09-24 LAB — FERRITIN: Ferritin: 9 ng/mL — ABNORMAL LOW (ref 11–307)

## 2023-09-24 LAB — VITAMIN B12: Vitamin B-12: 278 pg/mL (ref 180–914)

## 2023-09-24 LAB — TYPE AND SCREEN
ABO/RH(D): AB POS
Antibody Screen: NEGATIVE

## 2023-09-24 LAB — RETICULOCYTES
Immature Retic Fract: 23.2 % — ABNORMAL HIGH (ref 2.3–15.9)
RBC.: 4.28 MIL/uL (ref 3.87–5.11)
Retic Count, Absolute: 53.5 10*3/uL (ref 19.0–186.0)
Retic Ct Pct: 1.3 % (ref 0.4–3.1)

## 2023-09-24 MED ORDER — DOCUSATE SODIUM 100 MG PO CAPS: 100.0000 mg | ORAL_CAPSULE | Freq: Two times a day (BID) | ORAL | Status: DC | PRN

## 2023-09-24 MED ORDER — LACTATED RINGERS IV SOLN
125.0000 mL/h | INTRAVENOUS | Status: AC
  Administered 2023-09-24: 125 mL/h via INTRAVENOUS

## 2023-09-24 MED ORDER — ACETAMINOPHEN 325 MG PO TABS
650.0000 mg | ORAL_TABLET | ORAL | Status: DC | PRN
  Administered 2023-09-24: 650 mg via ORAL
  Filled 2023-09-24: qty 2

## 2023-09-24 MED ORDER — CALCIUM CARBONATE ANTACID 500 MG PO CHEW: 2.0000 | CHEWABLE_TABLET | ORAL | Status: DC | PRN

## 2023-09-24 MED ORDER — INFLUENZA VIRUS VACC SPLIT PF (FLUZONE) 0.5 ML IM SUSY
0.5000 mL | PREFILLED_SYRINGE | Freq: Once | INTRAMUSCULAR | Status: AC
  Administered 2023-09-24: 0.5 mL via INTRAMUSCULAR
  Filled 2023-09-24 (×2): qty 0.5

## 2023-09-24 NOTE — MAU Note (Signed)
[name] is a 27 y.o. at [redacted]w[redacted]d here in MAU reporting: she's having VB and left lower abdominal pain.  Reports VB has increased since last night. Pt seen in MAU last night for VB, VB has increased since last visit and now having lower abdominal pain.  RN shown pictures of VB from initial visit last night and pictures of VB after discharge, VB has increased from scant to moderate. LMP: 05/24/2023 Onset of complaint: last night Pain score: 3 Vitals:   09/24/23 0814  BP: 98/64  Pulse: 86  Resp: 18  Temp: 98.2 F (36.8 C)  SpO2: 100%     FHT:153 bpm Lab orders placed from triage:   None

## 2023-09-24 NOTE — H&P (Signed)
FACULTY PRACTICE ANTEPARTUM ADMISSION HISTORY AND PHYSICAL NOTE   History of Present Illness: [name] is a 27 y.o. [id number] at [redacted]w[redacted]d who presents to MAU with complaint of vaginal bleeding. Pt was seen yesterday in MAU for vaginal bleeding, at that time had very light vaginal bleeding and on sterile vaginal exam, was noted to have scant bleeding. U/S done showing +FCA and pt was discharged. She reports bleeding worsened overnight and she began experiencing LLQ pain, which kept her up all night. Reports moderate bleeding, with fresh red blood and some darker blood and clots in the toilet. Pt has pictures on her phone which she showed me. She denies feeling fevers, lightheaded, n/v, c/d.     Patient Active Problem List   Diagnosis Date Noted   Vaginal bleeding in pregnancy, second trimester 09/24/2023   Supervision of high risk pregnancy, antepartum 08/11/2023   Alpha thalassemia silent carrier 01/13/2020   Language barrier 02/21/2019   History of preterm delivery     Past Medical History:  Diagnosis Date   Alpha thalassemia silent carrier 01/13/2020    Past Surgical History:  Procedure Laterality Date   NO PAST SURGERIES      OB History  Gravida Para Term Preterm AB Living  4 3 2 1  0 3  SAB IAB Ectopic Multiple Live Births  0 0 0 0 3    # Outcome Date GA Lbr Len/2nd Weight Sex Type Anes PTL Lv  4 Current           3 Term 06/29/20 [redacted]w[redacted]d 15:36 / 00:39 2841 g M Vag-Spont EPI  LIV  2 Term 11/12/15 [redacted]w[redacted]d 11:30 / 00:11 2830 g M Vag-Spont EPI  LIV     Birth Comments: [place], otherwise WNL  1 Preterm 07/06/11 [redacted]w[redacted]d 42:40 / 01:52 1960 g F Vag-Spont None  LIV    Social History   Socioeconomic History   Marital status: Married    Spouse name: Not on file   Number of children: Not on file   Years of education: Not on file   Highest education level: Not on file  Occupational History   Not on file  Tobacco Use   Smoking status: Never   Smokeless tobacco: Never  Vaping Use    Vaping status: Never Used  Substance and Sexual Activity   Alcohol use: No   Drug use: Never   Sexual activity: Not Currently    Partners: Male    Birth control/protection: Pill  Other Topics Concern   Not on file  Social History Narrative   Not on file   Social Determinants of Health   Financial Resource Strain: Not on file  Food Insecurity: Not on file  Transportation Needs: Not on file  Physical Activity: Not on file  Stress: Not on file  Social Connections: Not on file    Family History  Problem Relation Age of Onset   Healthy Father    Healthy Mother     No Known Allergies  Medications Prior to Admission  Medication Sig Dispense Refill Last Dose   Prenatal Vit-Fe Fumarate-FA (GOODSENSE PRENATAL VITAMINS) 28-0.8 MG TABS Take 1 tablet by mouth daily. 30 tablet 11    ROS reviewed and pertinent positives and negatives as documented in HPI.   Vitals:  BP 98/64 (BP Location: Right Arm)   Pulse 86   Temp 98.2 F (36.8 C) (Oral)   Resp 18   Wt 65.3 kg   LMP 05/24/2023   SpO2 100%   BMI 29.08  kg/m   Physical Exam Exam conducted with a chaperone present.  Constitutional:      General: She is not in acute distress.    Appearance: Normal appearance. She is not ill-appearing.  HENT:     Head: Normocephalic and atraumatic.  Cardiovascular:     Rate and Rhythm: Normal rate.  Pulmonary:     Effort: Pulmonary effort is normal.     Breath sounds: Normal breath sounds.  Abdominal:     Palpations: Abdomen is soft.     Tenderness: There is abdominal tenderness (LLQ TTP). There is no guarding.  Genitourinary:    Comments: Old, dark blood in vagina noted, dark, mucusy blood noted coming from os, cervix is visually closed and long Musculoskeletal:        General: Normal range of motion.  Skin:    General: Skin is warm and dry.     Findings: No rash.  Neurological:     General: No focal deficit present.     Mental Status: She is alert and oriented to person, place,  and time.   Labs:  Results for orders placed or performed during the hospital encounter of 09/24/23 (from the past 24 hour(s))  CBC   Collection Time: 09/24/23  8:38 AM  Result Value Ref Range   WBC 7.9 4.0 - 10.5 K/uL   RBC 4.31 3.87 - 5.11 MIL/uL   Hemoglobin 9.2 (L) 12.0 - 15.0 g/dL   HCT 16.1 (L) 09.6 - 04.5 %   MCV 71.2 (L) 80.0 - 100.0 fL   MCH 21.3 (L) 26.0 - 34.0 pg   MCHC 30.0 30.0 - 36.0 g/dL   RDW 40.9 (H) 81.1 - 91.4 %   Platelets 346 150 - 400 K/uL   nRBC 0.0 0.0 - 0.2 %  Wet prep, genital   Collection Time: 09/24/23  8:45 AM  Result Value Ref Range   Yeast Wet Prep HPF POC NONE SEEN NONE SEEN   Trich, Wet Prep NONE SEEN NONE SEEN   Clue Cells Wet Prep HPF POC NONE SEEN NONE SEEN   WBC, Wet Prep HPF POC >=10 (A) <10   Sperm NONE SEEN     Imaging Studies: No results found.   Assessment and Plan: Patient Active Problem List   Diagnosis Date Noted   Vaginal bleeding in pregnancy, second trimester 09/24/2023   Supervision of high risk pregnancy, antepartum 08/11/2023   Alpha thalassemia silent carrier 01/13/2020   Language barrier 02/21/2019   History of preterm delivery    Admit to Antenatal  Vaginal bleeding in pregnancy, second trimester C/f subchorionic hemorrhage vs abruption per U/S, pt is hemodynamically stable, mild TTP of LLQ, speculum exam as above Plan for monitoring overnight   Anemia of pregnancy Anemia w/up ordered   Patient discussed w Dr. Vergie Living.  Sundra Aland, MD OB Fellow, Faculty Practice [facility], Center for [facility]

## 2023-09-24 NOTE — MAU Provider Note (Cosign Needed Addendum)
History     CSN: [id number]  Arrival date and time: 09/24/23 0755   Event Date/Time   First Provider Initiated Contact with Patient 09/24/23 1013      No chief complaint on file.  HPI [name] is a 27 y.o. [id number] at [redacted]w[redacted]d who presents to MAU with complaint of vaginal bleeding. Pt was seen yesterday in MAU for vaginal bleeding, at that time had very light vaginal bleeding and on sterile vaginal exam, was noted to have scant bleeding. U/S done showing +FCA and pt was discharged. She reports bleeding worsened overnight and she began experiencing LLQ pain, which kept her up all night. Reports moderate bleeding, with fresh red blood and some darker blood and clots in the toilet. Pt has pictures on her phone which she showed me. She denies feeling fevers, lightheaded, n/v, c/d.   Past Medical History:  Diagnosis Date   Alpha thalassemia silent carrier 01/13/2020    Past Surgical History:  Procedure Laterality Date   NO PAST SURGERIES      Family History  Problem Relation Age of Onset   Healthy Father    Healthy Mother     Social History   Tobacco Use   Smoking status: Never   Smokeless tobacco: Never  Vaping Use   Vaping status: Never Used  Substance Use Topics   Alcohol use: No   Drug use: Never    Allergies: No Known Allergies  Medications Prior to Admission  Medication Sig Dispense Refill Last Dose   Prenatal Vit-Fe Fumarate-FA (GOODSENSE PRENATAL VITAMINS) 28-0.8 MG TABS Take 1 tablet by mouth daily. 30 tablet 11    ROS reviewed and pertinent positives and negatives as documented in HPI.  Physical Exam   Blood pressure 98/64, pulse 86, temperature 98.2 F (36.8 C), temperature source Oral, resp. rate 18, weight 65.3 kg, last menstrual period 05/24/2023, SpO2 100%.  Physical Exam Exam conducted with a chaperone present.  Constitutional:      General: She is not in acute distress.    Appearance: Normal appearance. She is not ill-appearing.  HENT:     Head:  Normocephalic and atraumatic.  Cardiovascular:     Rate and Rhythm: Normal rate.  Pulmonary:     Effort: Pulmonary effort is normal.     Breath sounds: Normal breath sounds.  Abdominal:     Palpations: Abdomen is soft.     Tenderness: There is abdominal tenderness (LLQ TTP). There is no guarding.  Genitourinary:    Comments: Old, dark blood in vagina noted, dark, mucusy blood noted coming from os, cervix is visually closed and long Musculoskeletal:        General: Normal range of motion.  Skin:    General: Skin is warm and dry.     Findings: No rash.  Neurological:     General: No focal deficit present.     Mental Status: She is alert and oriented to person, place, and time.     MAU Course  Procedures  MDM 27 y.o. [id number] at [redacted]w[redacted]d who presents with c/o vaginal bleeding in second trimester, worse since presentation yesterday. She is hemodynamically stable, exam notable for mild TTP of LLQ, but overall no e/o acute abdomen. Labs reveal anemia w HgB 9.2 (apparent baseline ~10), wet prep negative. U/S showing small subchorionic hemorrhage vs abruption measuring 1.5x1.5x1.9cm.   10:41 AM  Speculum exam performed with chaperone at bedside -- scant bloody/mucusy blood coming from os. Discussed pt with Dr. Vergie Living, who recommends observation overnight.  Assessment and Plan  Vaginal bleeding in pregnancy, second trimester C/f subchorionic hemorrhage vs abruption per U/S, pt is hemodynamically stable, mild TTP of LLQ, speculum exam as above Plan for monitoring overnight  Anemia of pregnancy Anemia w/up ordered  Sundra Aland 09/24/2023, 10:41 AM

## 2023-09-25 DIAGNOSIS — O4692 Antepartum hemorrhage, unspecified, second trimester: Secondary | ICD-10-CM

## 2023-09-25 DIAGNOSIS — Z3A14 14 weeks gestation of pregnancy: Secondary | ICD-10-CM | POA: Diagnosis not present

## 2023-09-25 LAB — CBC
HCT: 27.9 % — ABNORMAL LOW (ref 36.0–46.0)
Hemoglobin: 8.5 g/dL — ABNORMAL LOW (ref 12.0–15.0)
MCH: 21.7 pg — ABNORMAL LOW (ref 26.0–34.0)
MCHC: 30.5 g/dL (ref 30.0–36.0)
MCV: 71.2 fL — ABNORMAL LOW (ref 80.0–100.0)
Platelets: 324 10*3/uL (ref 150–400)
RBC: 3.92 MIL/uL (ref 3.87–5.11)
RDW: 16.2 % — ABNORMAL HIGH (ref 11.5–15.5)
WBC: 7.8 10*3/uL (ref 4.0–10.5)
nRBC: 0 % (ref 0.0–0.2)

## 2023-09-25 LAB — GC/CHLAMYDIA PROBE AMP (~~LOC~~) NOT AT ARMC
Chlamydia: NEGATIVE
Comment: NEGATIVE
Comment: NORMAL
Neisseria Gonorrhea: NEGATIVE

## 2023-09-25 NOTE — Discharge Summary (Signed)
Physician Discharge Summary  Patient ID: [name] MRN: [id number] DOB/AGE: [birth year] 27 y.o.  Admit date: 09/24/2023 Discharge date: 09/25/2023  Admission Diagnoses:vaginal bleeding [redacted]w[redacted]d  Discharge Diagnoses:  Principal Problem:   Vaginal bleeding in pregnancy, second trimester   Discharged Condition: good  Hospital Course: [name] is a 27 y.o. [id number] at [redacted]w[redacted]d who presented to MAU with complaint of vaginal bleeding. Pt was seen 09/23/23 in MAU for vaginal bleeding, at that time had very light vaginal bleeding and on sterile vaginal exam, was noted to have scant bleeding. U/S done showing +FCA and pt was discharged. She reports bleeding worsened overnight and she began experiencing LLQ pain, which kept her up all night. Reports moderate bleeding, with fresh red blood and some darker blood and clots in the toilet. Pt has pictures on her phone which she showed me. She denies feeling fevers, lightheaded, n/v, c/d.      Consults: None  Significant Diagnostic Studies: radiology: Ultrasound: second trimester limited  Treatments: IV hydration  Discharge Exam: Blood pressure (!) 104/55, pulse 92, temperature 97.9 F (36.6 C), temperature source Oral, resp. rate 17, height 4\' 11"  (1.499 m), weight 65.3 kg, last menstrual period 05/24/2023, SpO2 100%. General appearance: alert, cooperative, and no distress Resp: effort normal GI: soft, non-tender; bowel sounds normal; no masses,  no organomegaly  Disposition: Discharge disposition: 01-Home or Self Care       Discharge Instructions     Discharge patient   Complete by: As directed    Discharge disposition: 01-Home or Self Care   Discharge patient date: 09/25/2023      Allergies as of 09/25/2023   No Known Allergies      Medication List     TAKE these medications    GoodSense Prenatal Vitamins 28-0.8 MG Tabs Take 1 tablet by mouth daily.        Follow-up Information     [facility]  [place] Follow up on 10/06/2023.   Specialty: Obstetrics and Gynecology Contact information: [address], [address] [place] [address] [phone]                Signed: Scheryl Darter 09/25/2023, 9:49 AM

## 2023-10-06 ENCOUNTER — Ambulatory Visit (INDEPENDENT_AMBULATORY_CARE_PROVIDER_SITE_OTHER): Payer: Medicaid Other | Admitting: Obstetrics and Gynecology

## 2023-10-06 ENCOUNTER — Encounter: Payer: Self-pay | Admitting: Obstetrics and Gynecology

## 2023-10-06 VITALS — BP 94/60 | HR 88 | Wt 143.0 lb

## 2023-10-06 DIAGNOSIS — Z3A16 16 weeks gestation of pregnancy: Secondary | ICD-10-CM

## 2023-10-06 DIAGNOSIS — D563 Thalassemia minor: Secondary | ICD-10-CM

## 2023-10-06 DIAGNOSIS — O0990 Supervision of high risk pregnancy, unspecified, unspecified trimester: Secondary | ICD-10-CM | POA: Diagnosis not present

## 2023-10-06 DIAGNOSIS — Z758 Other problems related to medical facilities and other health care: Secondary | ICD-10-CM

## 2023-10-06 DIAGNOSIS — Z8751 Personal history of pre-term labor: Secondary | ICD-10-CM

## 2023-10-06 DIAGNOSIS — Z603 Acculturation difficulty: Secondary | ICD-10-CM

## 2023-10-06 NOTE — Progress Notes (Signed)
   PRENATAL VISIT NOTE  Subjective:  [name] is a 27 y.o. [phone] at [redacted]w[redacted]d being seen today for ongoing prenatal care.  She is currently monitored for the following issues for this high-risk pregnancy and has History of preterm delivery; Language barrier; Alpha thalassemia silent carrier; Supervision of high risk pregnancy, antepartum; and Vaginal bleeding in pregnancy, second trimester on their problem list.  Patient reports no complaints.  Contractions: Not present. Vag. Bleeding: None.   . Denies leaking of fluid.   The following portions of the patient's history were reviewed and updated as appropriate: allergies, current medications, past family history, past medical history, past social history, past surgical history and problem list.   Objective:   Vitals:   10/06/23 0823  BP: 94/60  Pulse: 88  Weight: 143 lb (64.9 kg)    Fetal Status: Fetal Heart Rate (bpm): 157         General:  Alert, oriented and cooperative. Patient is in no acute distress.  Skin: Skin is warm and dry. No rash noted.   Cardiovascular: Normal heart rate noted  Respiratory: Normal respiratory effort, no problems with respiration noted  Abdomen: Soft, gravid, appropriate for gestational age.  Pain/Pressure: Absent     Pelvic: Cervical exam deferred        Extremities: Normal range of motion.  Edema: None  Mental Status: Normal mood and affect. Normal behavior. Normal judgment and thought content.   Assessment and Plan:  Pregnancy: [id number] at [redacted]w[redacted]d 1. Supervision of high risk pregnancy, antepartum Patient is doing well without complaints. She denies any further episodes of vaginal bleeding since her 11/4 overnight hospital stay Anatomy ultrasound on 12/4 AFP today  2. History of preterm delivery First pregnancy followed by 2 full term vaginal deliveries  3. Alpha thalassemia silent carrier   4. Language barrier   Preterm labor symptoms and general obstetric precautions including but not limited to  vaginal bleeding, contractions, leaking of fluid and fetal movement were reviewed in detail with the patient. Please refer to After Visit Summary for other counseling recommendations.   Return in about 4 weeks (around 11/03/2023) for in person, ROB, High risk.  Future Appointments  Date Time Provider Department Center  10/06/2023  8:35 AM [place] Ponder, Gigi Gin, MD [facility] None  10/25/2023  8:15 AM [facility] NURSE [facility]  10/25/2023  8:30 AM [facility] US3 [facility]    Catalina Antigua, MD

## 2023-10-08 LAB — AFP, SERUM, OPEN SPINA BIFIDA
AFP MoM: 1.17
AFP Value: 41.3 ng/mL
Gest. Age on Collection Date: 16.3 wk
Maternal Age At EDD: 28.8 a
OSBR Risk 1 IN: 7137
Test Results:: NEGATIVE
Weight: 143 [lb_av]

## 2023-10-12 DIAGNOSIS — Z8759 Personal history of other complications of pregnancy, childbirth and the puerperium: Secondary | ICD-10-CM | POA: Insufficient documentation

## 2023-10-13 ENCOUNTER — Inpatient Hospital Stay (HOSPITAL_COMMUNITY): Payer: Medicaid Other

## 2023-10-13 ENCOUNTER — Inpatient Hospital Stay (HOSPITAL_COMMUNITY)
Admission: AD | Admit: 2023-10-13 | Discharge: 2023-10-13 | Disposition: A | Discharge status: Home / Self Care | Payer: Medicaid Other | Attending: Obstetrics and Gynecology | Admitting: Obstetrics and Gynecology

## 2023-10-13 DIAGNOSIS — O26892 Other specified pregnancy related conditions, second trimester: Secondary | ICD-10-CM

## 2023-10-13 DIAGNOSIS — O23592 Infection of other part of genital tract in pregnancy, second trimester: Secondary | ICD-10-CM | POA: Insufficient documentation

## 2023-10-13 DIAGNOSIS — O209 Hemorrhage in early pregnancy, unspecified: Secondary | ICD-10-CM | POA: Diagnosis not present

## 2023-10-13 DIAGNOSIS — Z3A17 17 weeks gestation of pregnancy: Secondary | ICD-10-CM | POA: Insufficient documentation

## 2023-10-13 DIAGNOSIS — B9689 Other specified bacterial agents as the cause of diseases classified elsewhere: Secondary | ICD-10-CM | POA: Insufficient documentation

## 2023-10-13 DIAGNOSIS — N760 Acute vaginitis: Secondary | ICD-10-CM

## 2023-10-13 DIAGNOSIS — O99012 Anemia complicating pregnancy, second trimester: Secondary | ICD-10-CM | POA: Insufficient documentation

## 2023-10-13 DIAGNOSIS — N898 Other specified noninflammatory disorders of vagina: Secondary | ICD-10-CM

## 2023-10-13 LAB — WET PREP, GENITAL
Sperm: NONE SEEN
Trich, Wet Prep: NONE SEEN
WBC, Wet Prep HPF POC: <10 (ref <10)
Yeast Wet Prep HPF POC: NONE SEEN

## 2023-10-13 LAB — CBC
HCT: 28.7 % — ABNORMAL LOW (ref 36.0–46.0)
Hemoglobin: 8.6 g/dL — ABNORMAL LOW (ref 12.0–15.0)
MCH: 21.2 pg — ABNORMAL LOW (ref 26.0–34.0)
MCHC: 30 g/dL (ref 30.0–36.0)
MCV: 70.9 fL — ABNORMAL LOW (ref 80.0–100.0)
Platelets: 375 10*3/uL (ref 150–400)
RBC: 4.05 MIL/uL (ref 3.87–5.11)
RDW: 15.4 % (ref 11.5–15.5)
WBC: 9.1 10*3/uL (ref 4.0–10.5)
nRBC: 0 % (ref 0.0–0.2)

## 2023-10-13 MED ORDER — METRONIDAZOLE 500 MG PO TABS: 500.0000 mg | ORAL_TABLET | Freq: Two times a day (BID) | ORAL | 0 refills | Status: AC

## 2023-10-13 MED ORDER — FERROUS SULFATE 325 (65 FE) MG PO TBEC: 325.0000 mg | DELAYED_RELEASE_TABLET | Freq: Every day | ORAL | 3 refills | Status: DC

## 2023-10-13 NOTE — MAU Provider Note (Signed)
  History     CSN: [id number]  Arrival date and time: 10/13/23 1629   Event Date/Time   First Provider Initiated Contact with Patient 10/13/2023  4:44 PM   Chief Complaint  Patient presents with   Vaginal Bleeding   Vaginal Discharge    HPI  [name] is a 27 y.o. [id number] at [redacted]w[redacted]d who presents to the MAU for vaginal discharge and passage of clots. Started this AM, was admitted for observation earlier this month for VB. U/S showed placental lake vs subchorionic hemorrhage. VB stopped. Today had passage of brown clot and subsequent brown discharge. Having irritation of vulvovaginal area. No urinary sxs. No pain, n/v, LH.   Past Medical History:  Diagnosis Date   Alpha thalassemia silent carrier 01/13/2020    Past Surgical History:  Procedure Laterality Date   NO PAST SURGERIES      Family History  Problem Relation Age of Onset   Healthy Father    Healthy Mother     Social History   Tobacco Use   Smoking status: Never   Smokeless tobacco: Never  Vaping Use   Vaping status: Never Used  Substance Use Topics   Alcohol use: No   Drug use: Never    Allergies: No Known Allergies  No medications prior to admission.    ROS reviewed and pertinent positives and negatives as documented in HPI.  Physical Exam   Blood pressure 112/65, pulse 88, temperature 98.2 F (36.8 C), temperature source Oral, resp. rate 14, height 4\' 11"  (1.499 m), weight 66.1 kg, last menstrual period 05/24/2023, SpO2 100%.  Physical Exam Exam conducted with a chaperone present.  Constitutional:      General: She is not in acute distress.    Appearance: Normal appearance. She is not ill-appearing.  HENT:     Head: Normocephalic and atraumatic.  Cardiovascular:     Rate and Rhythm: Normal rate.  Pulmonary:     Effort: Pulmonary effort is normal.     Breath sounds: Normal breath sounds.  Abdominal:     Palpations: Abdomen is soft.     Tenderness: There is no abdominal tenderness. There is no  guarding.  Genitourinary:    Comments: Scant brown discharge present, no active bleeding or clots noted Musculoskeletal:        General: Normal range of motion.  Skin:    General: Skin is warm and dry.     Findings: No rash.  Neurological:     General: No focal deficit present.     Mental Status: She is alert and oriented to person, place, and time.     MAU Course  Procedures  MDM 27 y.o. [id number] at [redacted]w[redacted]d presenting for brown discharge. Known hx placental lake vs subchorionic hemorrhage. Given old blood, suspect discharge may be from this resolving. No active bleeding or clots on exam. Wet prep is + for BV. Discussed results with patient. HgB stable, rec starting Fe. U/S w/o e/o placental cause to bleeding. Stable for d/c. Return precautions discussed.  Assessment and Plan  BV (bacterial vaginosis) - Plan: Discharge patient Rx for Flagyl sent  Vaginal discharge - Plan: Discharge patient As above  Anemia of pregnancy in second trimester PO Fe sent  Sundra Aland, MD OB Fellow, Faculty Practice [facility], Center for [facility]  10/13/2023, 10:29 PM

## 2023-10-13 NOTE — MAU Note (Signed)
.  [name] is a 27 y.o. at [redacted]w[redacted]d here in MAU reporting: Brown vaginal discharge/blood since this morning around 0600. She reports she has also passed two large blood clots. She reports vaginal itching since 0600 as well.   Patient has photos on her phone.   Patient was admitted to [facility] on 11/3 for heavy vaginal bleeding.   US findings from 11/3: This patient presented to the MAU due to vaginal bleeding. A limited ultrasound performed today shows a viable singleton intrauterine gestation in the breech presentation. There was normal amniotic fluid noted. A normal-appearing posterior placenta is noted. A small 1 cm most likely placental lake is noted in the center of the posterior placenta. There were no signs of placenta previa noted today.  Onset of complaint: 0600 Pain score: Denies pain.  Vitals:   10/13/23 1644  BP: (!) 90/57  Pulse: 83  Temp: 98.2 F (36.8 C)  SpO2: 100%      FHT: 150 doppler Lab orders placed from triage: UA

## 2023-10-16 LAB — GC/CHLAMYDIA PROBE AMP (~~LOC~~) NOT AT ARMC
Chlamydia: NEGATIVE
Comment: NEGATIVE
Comment: NORMAL
Neisseria Gonorrhea: NEGATIVE

## 2023-10-22 DIAGNOSIS — Z419 Encounter for procedure for purposes other than remedying health state, unspecified: Secondary | ICD-10-CM | POA: Diagnosis not present

## 2023-10-25 ENCOUNTER — Other Ambulatory Visit: Payer: Self-pay

## 2023-10-25 ENCOUNTER — Other Ambulatory Visit: Payer: Self-pay | Admitting: *Deleted

## 2023-10-25 ENCOUNTER — Ambulatory Visit: Payer: Medicaid Other | Attending: Obstetrics and Gynecology

## 2023-10-25 ENCOUNTER — Encounter: Payer: Self-pay | Admitting: *Deleted

## 2023-10-25 ENCOUNTER — Ambulatory Visit: Payer: Medicaid Other | Admitting: *Deleted

## 2023-10-25 VITALS — BP 94/58 | HR 102

## 2023-10-25 DIAGNOSIS — Z8759 Personal history of other complications of pregnancy, childbirth and the puerperium: Secondary | ICD-10-CM | POA: Insufficient documentation

## 2023-10-25 DIAGNOSIS — O99012 Anemia complicating pregnancy, second trimester: Secondary | ICD-10-CM

## 2023-10-25 DIAGNOSIS — Z362 Encounter for other antenatal screening follow-up: Secondary | ICD-10-CM

## 2023-10-25 DIAGNOSIS — O09212 Supervision of pregnancy with history of pre-term labor, second trimester: Secondary | ICD-10-CM

## 2023-10-25 DIAGNOSIS — O09292 Supervision of pregnancy with other poor reproductive or obstetric history, second trimester: Secondary | ICD-10-CM | POA: Diagnosis not present

## 2023-10-25 DIAGNOSIS — D563 Thalassemia minor: Secondary | ICD-10-CM | POA: Diagnosis not present

## 2023-10-25 DIAGNOSIS — Z3A19 19 weeks gestation of pregnancy: Secondary | ICD-10-CM | POA: Diagnosis not present

## 2023-10-25 DIAGNOSIS — O09899 Supervision of other high risk pregnancies, unspecified trimester: Secondary | ICD-10-CM

## 2023-10-25 DIAGNOSIS — O0990 Supervision of high risk pregnancy, unspecified, unspecified trimester: Secondary | ICD-10-CM

## 2023-11-03 ENCOUNTER — Encounter: Payer: Medicaid Other | Admitting: Obstetrics and Gynecology

## 2023-11-03 ENCOUNTER — Telehealth: Payer: Self-pay | Admitting: Obstetrics and Gynecology

## 2023-11-03 NOTE — Telephone Encounter (Signed)
Pt called and stated she can't make appointment on 12/13 because she doesn't have childcare for her baby. I made several attempts to reschedule pt for the same week but pt said these days don't work and I need a Friday morning. I schedule pt on 1/3 with [name]. Pt stated this works and I will make this appointment.

## 2023-11-22 DIAGNOSIS — Z419 Encounter for procedure for purposes other than remedying health state, unspecified: Secondary | ICD-10-CM | POA: Diagnosis not present

## 2023-11-22 NOTE — L&D Delivery Note (Signed)
 Labor Progress Celester Morgan is a 28 y.o. female [phone] with IUP at [redacted]w[redacted]d admitted for SROM and early labor.  She progressed without augmentation to complete and pushed less than 30 minutes to deliver.  Cord clamping delayed less than 1 minute given slow infant transition.  Cord clamped and cut by CNM and infant taken to warmer for further evaluation.  Placenta intact and spontaneous, bleeding minimal. Intact perineum, no repair. Infant with slow transition initially, but then transitioned well and was returned to pt arms with 5 minute Apgar of 9.  Mom and baby stable prior to transfer to postpartum. She plans on breastfeeding. She requests BTL for birth control.   Delivery Note At 2:35 PM a viable and healthy female was delivered via Vaginal, Spontaneous (Presentation: Middle Occiput Anterior).  APGAR: 2, 9; weight 7 lb 9.7 oz (3450 g).   Placenta status: Spontaneous, Intact.  Cord: 3 vessels with the following complications: None.    Anesthesia: Epidural Episiotomy: None Lacerations: None Suture Repair: n/a Est. Blood Loss (mL): 125  Mom to postpartum.  Baby to Couplet care / Skin to Skin.  Arlester Bence 05/10/2024, 9:12 PM

## 2023-11-23 NOTE — Progress Notes (Signed)
   PRENATAL VISIT NOTE  Subjective:  [name] is a 28 y.o. [phone] at [redacted]w[redacted]d being seen today for ongoing prenatal care.  She is currently monitored for the following issues for this high-risk pregnancy and has History of preterm delivery; Language barrier; Alpha thalassemia silent carrier; Supervision of high risk pregnancy, antepartum; Vaginal bleeding in pregnancy, second trimester; and History of prior pregnancy with IUGR newborn on their problem list.  Patient reports no complaints. Denies leaking of fluid, vaginal bleeding, contractions. She endorses fetal movement.   The following portions of the patient's history were reviewed and updated as appropriate: allergies, current medications, past family history, past medical history, past social history, past surgical history and problem list.   Objective:   Vitals:   11/24/23 0850  BP: 104/66  Pulse: 96  Weight: 146 lb 12.8 oz (66.6 kg)    Fetal Status: Fetal Heart Rate (bpm): 147 Fundal Height: 23 cm Movement: Present     General:  Alert, oriented and cooperative. Patient is in no acute distress.  Skin: Skin is warm and dry. No rash noted.   Cardiovascular: Normal heart rate noted  Respiratory: Normal respiratory effort, no problems with respiration noted  Abdomen: Soft, gravid, appropriate for gestational age.  Pain/Pressure: Absent     Pelvic: Cervical exam deferred        Extremities: Normal range of motion.  Edema: None  Mental Status: Normal mood and affect. Normal behavior. Normal judgment and thought content.   Assessment and Plan:  Pregnancy: G4P2103 at [redacted]w[redacted]d 1. Supervision of high risk pregnancy, antepartum (Primary) -Patient stable and feeling well. Feeling vigorous movement. FHR and FH wnl  -10/25/23 anatomy scan without abnormalities -Negative AFP -MFM OB US  scheduled 12/01/23 -Considering pp BTL. Discussed risks/benefits.  -Cont. Iron  and prenatal supplements  2. [redacted] weeks gestation of pregnancy -Anticipatory  guidance about next visits/weeks of pregnancy given  3. History of preterm delivery -In first pregnancy followed by two term vaginal deliveries  4. Alpha thalassemia silent carrier   5. Language barrier Darice interpreter utilized during encounter   Preterm labor symptoms and general obstetric precautions including but not limited to vaginal bleeding, contractions, leaking of fluid and fetal movement were reviewed in detail with the patient.  Please refer to After Visit Summary for other counseling recommendations.   Return in about 4 weeks (around 12/22/2023) for HOB+GTT.  Future Appointments  Date Time Provider Department Center  12/01/2023  9:30 AM [facility] US5 [facility]  12/22/2023  8:00 AM [facility] None  12/22/2023  8:55 AM Starla Harland BROCKS, MD [facility] None    Jorene FORBES Moats, PA-C

## 2023-11-24 ENCOUNTER — Encounter: Payer: Self-pay | Admitting: Obstetrics and Gynecology

## 2023-11-24 ENCOUNTER — Ambulatory Visit (INDEPENDENT_AMBULATORY_CARE_PROVIDER_SITE_OTHER): Payer: Medicaid Other | Admitting: Obstetrics and Gynecology

## 2023-11-24 VITALS — BP 104/66 | HR 96 | Wt 146.8 lb

## 2023-11-24 DIAGNOSIS — D563 Thalassemia minor: Secondary | ICD-10-CM

## 2023-11-24 DIAGNOSIS — O0992 Supervision of high risk pregnancy, unspecified, second trimester: Secondary | ICD-10-CM

## 2023-11-24 DIAGNOSIS — Z8751 Personal history of pre-term labor: Secondary | ICD-10-CM

## 2023-11-24 DIAGNOSIS — O0990 Supervision of high risk pregnancy, unspecified, unspecified trimester: Secondary | ICD-10-CM

## 2023-11-24 DIAGNOSIS — Z758 Other problems related to medical facilities and other health care: Secondary | ICD-10-CM

## 2023-11-24 DIAGNOSIS — Z3A23 23 weeks gestation of pregnancy: Secondary | ICD-10-CM

## 2023-11-24 DIAGNOSIS — Z603 Acculturation difficulty: Secondary | ICD-10-CM

## 2023-11-24 NOTE — Progress Notes (Signed)
 Pt presents for ROB visit. No concerns

## 2023-12-01 ENCOUNTER — Other Ambulatory Visit: Payer: Self-pay

## 2023-12-01 ENCOUNTER — Ambulatory Visit: Payer: Medicaid Other | Attending: Obstetrics and Gynecology

## 2023-12-01 ENCOUNTER — Other Ambulatory Visit: Payer: Self-pay | Admitting: *Deleted

## 2023-12-01 DIAGNOSIS — O09899 Supervision of other high risk pregnancies, unspecified trimester: Secondary | ICD-10-CM | POA: Insufficient documentation

## 2023-12-01 DIAGNOSIS — Z362 Encounter for other antenatal screening follow-up: Secondary | ICD-10-CM | POA: Insufficient documentation

## 2023-12-01 DIAGNOSIS — D563 Thalassemia minor: Secondary | ICD-10-CM | POA: Diagnosis not present

## 2023-12-01 DIAGNOSIS — Z8759 Personal history of other complications of pregnancy, childbirth and the puerperium: Secondary | ICD-10-CM | POA: Insufficient documentation

## 2023-12-01 DIAGNOSIS — O99012 Anemia complicating pregnancy, second trimester: Secondary | ICD-10-CM | POA: Diagnosis not present

## 2023-12-01 DIAGNOSIS — Z3A24 24 weeks gestation of pregnancy: Secondary | ICD-10-CM

## 2023-12-01 DIAGNOSIS — O09212 Supervision of pregnancy with history of pre-term labor, second trimester: Secondary | ICD-10-CM

## 2023-12-01 DIAGNOSIS — O09292 Supervision of pregnancy with other poor reproductive or obstetric history, second trimester: Secondary | ICD-10-CM

## 2023-12-12 ENCOUNTER — Inpatient Hospital Stay (HOSPITAL_COMMUNITY)
Admission: AD | Admit: 2023-12-12 | Discharge: 2023-12-12 | Disposition: A | Discharge status: Home / Self Care | Payer: Medicaid Other | Attending: Family Medicine | Admitting: Family Medicine

## 2023-12-12 ENCOUNTER — Encounter (HOSPITAL_COMMUNITY): Payer: Self-pay | Admitting: Obstetrics and Gynecology

## 2023-12-12 DIAGNOSIS — L2989 Other pruritus: Secondary | ICD-10-CM | POA: Diagnosis not present

## 2023-12-12 DIAGNOSIS — O26892 Other specified pregnancy related conditions, second trimester: Secondary | ICD-10-CM | POA: Diagnosis not present

## 2023-12-12 DIAGNOSIS — L299 Pruritus, unspecified: Secondary | ICD-10-CM | POA: Diagnosis not present

## 2023-12-12 DIAGNOSIS — Z3A26 26 weeks gestation of pregnancy: Secondary | ICD-10-CM | POA: Diagnosis not present

## 2023-12-12 DIAGNOSIS — R21 Rash and other nonspecific skin eruption: Secondary | ICD-10-CM | POA: Diagnosis present

## 2023-12-12 DIAGNOSIS — L210 Seborrhea capitis: Secondary | ICD-10-CM

## 2023-12-12 MED ORDER — NYSTATIN-TRIAMCINOLONE 100000-0.1 UNIT/GM-% EX OINT: 1.0000 | TOPICAL_OINTMENT | Freq: Two times a day (BID) | CUTANEOUS | 0 refills | Status: DC

## 2023-12-12 MED ORDER — ACYCLOVIR 400 MG PO TABS: 400.0000 mg | ORAL_TABLET | Freq: Every day | ORAL | 0 refills | Status: AC

## 2023-12-12 MED ORDER — HYDROXYZINE HCL 10 MG PO TABS: 10.0000 mg | ORAL_TABLET | Freq: Three times a day (TID) | ORAL | 0 refills | Status: AC | PRN

## 2023-12-12 NOTE — MAU Provider Note (Addendum)
History     CSN: [id number]  Arrival date and time: 12/12/23 4098   Event Date/Time   First Provider Initiated Contact with Patient 12/12/23 1111      Chief Complaint  Patient presents with   Rash   Pruritis   Pt presents with itchy rash that started suddenly Saturday evening (1/18). It began in her groin and spread somewhat on her lower abdomen. Now it is also present on upper abdomen, under folds of her breasts, and on her breasts, lower back, and upper arms. Denies any new soaps, detergents, lotions, medications, travel, sick contacts. Has no pets or other animal contact. Has been scratching. Has not tried any medications or creams to alleviate symptoms.  Rash Pertinent negatives include no congestion, cough, diarrhea, fatigue, fever, shortness of breath, sore throat or vomiting.    OB History     Gravida  4   Para  3   Term  2   Preterm  1   AB  0   Living  3      SAB  0   IAB  0   Ectopic  0   Multiple  0   Live Births  3           Past Medical History:  Diagnosis Date   Alpha thalassemia silent carrier 01/13/2020   Medical history non-contributory     Past Surgical History:  Procedure Laterality Date   NO PAST SURGERIES      Family History  Problem Relation Age of Onset   Healthy Father    Healthy Mother     Social History   Tobacco Use   Smoking status: Never   Smokeless tobacco: Never  Vaping Use   Vaping status: Never Used  Substance Use Topics   Alcohol use: No   Drug use: Never    Allergies: No Known Allergies  No medications prior to admission.    Review of Systems  Constitutional:  Negative for chills, diaphoresis, fatigue and fever.  HENT:  Negative for congestion, sinus pain, sneezing and sore throat.   Respiratory:  Negative for cough, shortness of breath and wheezing.   Cardiovascular:  Negative for chest pain, palpitations and leg swelling.  Gastrointestinal:  Negative for abdominal pain, diarrhea, nausea  and vomiting.  Genitourinary:  Negative for dysuria, vaginal bleeding and vaginal discharge.  Skin:  Positive for rash.  Neurological:  Negative for light-headedness and headaches.   Physical Exam   Blood pressure 101/67, pulse 86, temperature 97.8 F (36.6 C), temperature source Oral, resp. rate 18, height 4\' 11"  (1.499 m), weight 67.6 kg, last menstrual period 05/24/2023, SpO2 100%.  Physical Exam Vitals reviewed.  Constitutional:      General: She is not in acute distress.    Appearance: Normal appearance.  HENT:     Head: Normocephalic.     Nose: No congestion or rhinorrhea.     Mouth/Throat:     Mouth: Mucous membranes are moist.     Pharynx: Oropharynx is clear.  Eyes:     Extraocular Movements: Extraocular movements intact.  Cardiovascular:     Rate and Rhythm: Normal rate and regular rhythm.     Heart sounds: Normal heart sounds.  Pulmonary:     Effort: Pulmonary effort is normal.     Breath sounds: Normal breath sounds.  Abdominal:     Palpations: Abdomen is soft.     Tenderness: There is no abdominal tenderness.  Musculoskeletal:     Cervical back: Normal  range of motion and neck supple.  Skin:    General: Skin is warm and dry.     Findings: Rash (hyperpigmented maculopaupular rash in intertrigenous regions of groin/under breasts; scattered excoriations over abdomen and breasts with some also present on upper arms. Does not involve hands, feet, mucosal surfaces.) present.  Neurological:     General: No focal deficit present.     Mental Status: She is alert.    NST:  Baseline: 140 bpm, Variability: Good {> 6 bpm), Accelerations: Non-reactive but appropriate for gestational age, and Decelerations: Absent Toco: Quiet  MAU Course  Procedures  MDM: moderate  This patient presents to the ED for concern of   Chief Complaint  Patient presents with   Rash   Pruritis     These complains involves an extensive number of treatment options, and is a complaint that  carries with it a high risk of complications and morbidity.  The differential diagnosis for  1. Itching, rash INCLUDES cholestasis of pregnancy, vulvovaginal candidiasis, scabies, pityriasis rosea, and other etiologies - atypical presentation but most likely pityriasis given onset and description of spread, as well as lack of other sick symptoms, skin peeling, mucosal involvement, etc.  - presence of rash with the pruritus makes cholestasis of pregnancy unlikely  - distribution is unlikely for candidiasis, scabies  Co morbidities that complicate the patient evaluation: Patient Active Problem List   Diagnosis Date Noted   History of prior pregnancy with IUGR newborn 10/12/2023   Vaginal bleeding in pregnancy, second trimester 09/24/2023   Supervision of high risk pregnancy, antepartum 08/11/2023   Alpha thalassemia silent carrier 01/13/2020   Language barrier 02/21/2019   History of preterm delivery      Additional history obtained from Other Sister  Interpreter services used: Sister provides interpreter services per pt request. Outside interpretation services offered and pt declined.  External records from outside source obtained and reviewed including Prenatal care records  Lab Tests: not indicated  Medicines ordered and prescription drug management:  Medications:  Meds ordered this encounter  Medications   nystatin-triamcinolone ointment (MYCOLOG)    Sig: Apply 1 Application topically 2 (two) times daily.    Dispense:  30 g    Refill:  0   acyclovir (ZOVIRAX) 400 MG tablet    Sig: Take 1 tablet (400 mg total) by mouth 5 (five) times daily for 10 days.    Dispense:  50 tablet    Refill:  0   hydrOXYzine (ATARAX) 10 MG tablet    Sig: Take 1 tablet (10 mg total) by mouth 3 (three) times daily as needed for up to 10 days for itching.    Dispense:  30 tablet    Refill:  0   I have reviewed the patients home medicines and have made adjustments as needed  Critical  Interventions: Other none  MAU Course:  11:30AM - pt seen and evaluated by provider. Fetal monitoring excellent while here, will remove at this time. Pt stable for discharge with prescriptions as above.  I reevaluated the patient and found that she is stable.  Dispostion: discharged    Assessment and Plan  Pruritus  [redacted] weeks gestation of pregnancy  Pityriasis   Pruritus - cause not immediately obvious however very low suspicion for infectious etiology. - prescribed topical mycolog, with oral acyclovir and atarax, which should address rash and itching - may need dermatology referral if this does not resolve in several weeks 2. Pityriasis - medications as above 3. [redacted] weeks  gestation of pregnancy  Allergies as of 12/12/2023   No Known Allergies      Medication List     TAKE these medications    acyclovir 400 MG tablet Commonly known as: ZOVIRAX Take 1 tablet (400 mg total) by mouth 5 (five) times daily for 10 days.   ferrous sulfate 325 (65 FE) MG EC tablet Take 1 tablet (325 mg total) by mouth daily with breakfast.   GoodSense Prenatal Vitamins 28-0.8 MG Tabs Take 1 tablet by mouth daily.   hydrOXYzine 10 MG tablet Commonly known as: ATARAX Take 1 tablet (10 mg total) by mouth 3 (three) times daily as needed for up to 10 days for itching.   nystatin-triamcinolone ointment Commonly known as: MYCOLOG Apply 1 Application topically 2 (two) times daily.        Cyndia Skeeters 12/12/2023, 12:08 PM

## 2023-12-12 NOTE — MAU Note (Signed)
.  [name] is a 28 y.o. at [redacted]w[redacted]d here in MAU reporting: On Saturday she noticed a rash that started on her abd and groin area. (Trunk) It is very itchy . Denies any fever or chills.  Denies any pain and good fetal movement reported.  LMP:  Onset of complaint: Saturday Pain score: 0 Vitals:   12/12/23 1024  BP: (!) 93/58  Pulse: (!) 102  Resp: 18  Temp: 97.8 F (36.6 C)     FHT: 145  Lab orders placed from triage: 0

## 2023-12-12 NOTE — Discharge Instructions (Addendum)
You were seen today for a rash with itching. We also monitored your baby's heart rate for a time and your baby looked very healthy. I do not think that this rash is dangerous to your or your baby.  We are sending some medicines to your pharmacy to help with the itching: - mycolog ointment: use this on your skin 2 times a day to help with the rash and itching - acyclovir pills: take these 5 times a day (every 3 hours) until you run out; this should help with the rash - atarax pills: take these 3 times a day if needed for itching. If you are not itching you do not have to take this medicine.  It may take 1-2 weeks or longer for the rash to get better. If the rash gets worse, continues to spread, affects your hands/feet/mouth, becomes painful, if your skin begins to peel, or if the rash concerns you in any other way please return to the MAU. If this does not get better you may also need to see a dermatologist.

## 2023-12-22 ENCOUNTER — Ambulatory Visit (INDEPENDENT_AMBULATORY_CARE_PROVIDER_SITE_OTHER): Payer: Medicaid Other | Admitting: Obstetrics & Gynecology

## 2023-12-22 ENCOUNTER — Other Ambulatory Visit: Payer: Medicaid Other

## 2023-12-22 VITALS — BP 103/71 | HR 96 | Wt 150.0 lb

## 2023-12-22 DIAGNOSIS — Z3009 Encounter for other general counseling and advice on contraception: Secondary | ICD-10-CM

## 2023-12-22 DIAGNOSIS — D563 Thalassemia minor: Secondary | ICD-10-CM

## 2023-12-22 DIAGNOSIS — O0990 Supervision of high risk pregnancy, unspecified, unspecified trimester: Secondary | ICD-10-CM

## 2023-12-22 DIAGNOSIS — Z3A27 27 weeks gestation of pregnancy: Secondary | ICD-10-CM

## 2023-12-22 DIAGNOSIS — Z8759 Personal history of other complications of pregnancy, childbirth and the puerperium: Secondary | ICD-10-CM

## 2023-12-22 DIAGNOSIS — Z758 Other problems related to medical facilities and other health care: Secondary | ICD-10-CM

## 2023-12-22 DIAGNOSIS — Z8751 Personal history of pre-term labor: Secondary | ICD-10-CM

## 2023-12-22 DIAGNOSIS — Z603 Acculturation difficulty: Secondary | ICD-10-CM

## 2023-12-22 NOTE — Progress Notes (Addendum)
   PRENATAL VISIT NOTE  Subjective:  [name] is a 28 y.o. [phone] at [redacted]w[redacted]d being seen today for ongoing prenatal care.  She is currently monitored for the following issues for this high-risk pregnancy and has History of preterm delivery; Language barrier; Alpha thalassemia silent carrier; Supervision of high risk pregnancy, antepartum; Vaginal bleeding in pregnancy, second trimester; History of prior pregnancy with IUGR newborn; Unwanted fertility; and [redacted] weeks gestation of pregnancy on their problem list.  Patient reports some upper leg pain, especially with pushing on it with her hand/fingers. She has not tried tylenol or other remedies.  Her rash diagnosed as pityriasis recently at MAU is improving. She will be finishing the acyclovir in the near future. Contractions: Not present. Vag. Bleeding: None.  Movement: Present. Denies leaking of fluid.   She tells me that she does not want any more pregnancies, wants a BTL after delivery of this baby.  The following portions of the patient's history were reviewed and updated as appropriate: allergies, current medications, past family history, past medical history, past social history, past surgical history and problem list.   Objective:   Vitals:   12/22/23 0821  BP: 103/71  Pulse: 96  Weight: 150 lb (68 kg)    Fetal Status: Fetal Heart Rate (bpm): 135 Fundal Height: 29 cm Movement: Present     General:  Alert, oriented and cooperative. Patient is in no acute distress.  Skin: Skin is warm and dry. No rash noted.   Cardiovascular: Normal heart rate noted  Respiratory: Normal respiratory effort, no problems with respiration noted  Abdomen: Soft, gravid, appropriate for gestational age.  Pain/Pressure: Present     Pelvic: Cervical exam deferred        Extremities: Normal range of motion.     Mental Status: Normal mood and affect. Normal behavior. Normal judgment and thought content.   I could not find anything visibly abnormal with her legs  (other than the rash). I suggested tylenol, heat. Assessment and Plan:  Pregnancy: [id number] at [redacted]w[redacted]d 1. Supervision of high risk pregnancy, antepartum (Primary) - flu vaccine already given - Glucose Tolerance, 2 Hours w/1 Hour - RPR - HIV antibody (with reflex) - CBC - TDAP at next visit per patient request  2. History of preterm delivery - no signs/symptoms at present  3. Language barrier - video interpretor present  4. Alpha thalassemia silent carrier   5. [redacted] weeks gestation of pregnancy   6. Unwanted fertility - Medicaid forms signed today  7. History of prior pregnancy with IUGR newborn - recent MFM scan with 34% growth, repeat scan scheduled for 01/2024  Preterm labor symptoms and general obstetric precautions including but not limited to vaginal bleeding, contractions, leaking of fluid and fetal movement were reviewed in detail with the patient. Please refer to After Visit Summary for other counseling recommendations.   No follow-ups on file.  Future Appointments  Date Time Provider Department Center  12/22/2023  8:55 AM Allie Bossier, MD [facility] None  01/26/2024  9:15 AM [facility] NURSE [facility]  01/26/2024  9:30 AM [facility] US1 [facility]    Allie Bossier, MD

## 2023-12-22 NOTE — Progress Notes (Signed)
Pt states she has some left side leg pain.

## 2023-12-23 DIAGNOSIS — Z419 Encounter for procedure for purposes other than remedying health state, unspecified: Secondary | ICD-10-CM | POA: Diagnosis not present

## 2023-12-23 LAB — CBC
Hematocrit: 27.9 % — ABNORMAL LOW (ref 34.0–46.6)
Hemoglobin: 8 g/dL — ABNORMAL LOW (ref 11.1–15.9)
MCH: 19.6 pg — ABNORMAL LOW (ref 26.6–33.0)
MCHC: 28.7 g/dL — ABNORMAL LOW (ref 31.5–35.7)
MCV: 68 fL — ABNORMAL LOW (ref 79–97)
Platelets: 410 10*3/uL (ref 150–450)
RBC: 4.08 x10E6/uL (ref 3.77–5.28)
RDW: 16.9 % — ABNORMAL HIGH (ref 11.7–15.4)
WBC: 11 10*3/uL — ABNORMAL HIGH (ref 3.4–10.8)

## 2023-12-23 LAB — GLUCOSE TOLERANCE, 2 HOURS W/ 1HR
Glucose, 1 hour: 138 mg/dL (ref 70–179)
Glucose, 2 hour: 87 mg/dL (ref 70–152)
Glucose, Fasting: 73 mg/dL (ref 70–91)

## 2023-12-23 LAB — RPR: RPR Ser Ql: NONREACTIVE

## 2023-12-23 LAB — HIV ANTIBODY (ROUTINE TESTING W REFLEX): HIV Screen 4th Generation wRfx: NONREACTIVE

## 2023-12-25 ENCOUNTER — Other Ambulatory Visit: Payer: Self-pay | Admitting: Obstetrics and Gynecology

## 2023-12-25 ENCOUNTER — Other Ambulatory Visit: Payer: Self-pay | Admitting: Obstetrics & Gynecology

## 2023-12-25 ENCOUNTER — Encounter: Payer: Self-pay | Admitting: Obstetrics & Gynecology

## 2023-12-25 DIAGNOSIS — O99019 Anemia complicating pregnancy, unspecified trimester: Secondary | ICD-10-CM | POA: Insufficient documentation

## 2023-12-26 ENCOUNTER — Telehealth: Payer: Self-pay | Admitting: Pharmacy Technician

## 2023-12-26 NOTE — Telephone Encounter (Signed)
 Dr. Alger,  Fyi note:  patient will be scheduled as soon as possible.  Auth Submission: NO AUTH NEEDED Site of care: Site of care: [facility] Payer: [employer] medicaid Medication & CPT/J Code(s) submitted: Venofer  (Iron  Sucrose) J1756 Route of submission (phone, fax, portal):  Phone # Fax # Auth type: Buy/Bill PB Units/visits requested: 2 doses Reference number:  Approval from: 12/26/23 to 05/24/24

## 2023-12-27 ENCOUNTER — Other Ambulatory Visit: Payer: Self-pay | Admitting: Family Medicine

## 2023-12-31 ENCOUNTER — Other Ambulatory Visit: Payer: Self-pay | Admitting: Family Medicine

## 2024-01-05 ENCOUNTER — Ambulatory Visit (INDEPENDENT_AMBULATORY_CARE_PROVIDER_SITE_OTHER): Payer: Medicaid Other | Admitting: Obstetrics

## 2024-01-05 VITALS — BP 101/66 | HR 114 | Wt 154.2 lb

## 2024-01-05 DIAGNOSIS — O99019 Anemia complicating pregnancy, unspecified trimester: Secondary | ICD-10-CM | POA: Diagnosis not present

## 2024-01-05 DIAGNOSIS — Z8751 Personal history of pre-term labor: Secondary | ICD-10-CM

## 2024-01-05 DIAGNOSIS — Z758 Other problems related to medical facilities and other health care: Secondary | ICD-10-CM

## 2024-01-05 DIAGNOSIS — L299 Pruritus, unspecified: Secondary | ICD-10-CM | POA: Diagnosis not present

## 2024-01-05 DIAGNOSIS — D563 Thalassemia minor: Secondary | ICD-10-CM

## 2024-01-05 DIAGNOSIS — Z8759 Personal history of other complications of pregnancy, childbirth and the puerperium: Secondary | ICD-10-CM

## 2024-01-05 DIAGNOSIS — O0990 Supervision of high risk pregnancy, unspecified, unspecified trimester: Secondary | ICD-10-CM | POA: Diagnosis not present

## 2024-01-05 DIAGNOSIS — Z603 Acculturation difficulty: Secondary | ICD-10-CM

## 2024-01-05 DIAGNOSIS — Z23 Encounter for immunization: Secondary | ICD-10-CM | POA: Diagnosis not present

## 2024-01-05 MED ORDER — TRIAMCINOLONE ACETONIDE 0.025 % EX OINT: 1.0000 | TOPICAL_OINTMENT | Freq: Two times a day (BID) | CUTANEOUS | 1 refills | Status: DC

## 2024-01-05 NOTE — Progress Notes (Signed)
Subjective:  [name] is a 28 y.o. [phone] at [redacted]w[redacted]d being seen today for ongoing prenatal care.  She is currently monitored for the following issues for this high-risk pregnancy and has History of preterm delivery; Language barrier; Alpha thalassemia silent carrier; Supervision of high risk pregnancy, antepartum; Vaginal bleeding in pregnancy, second trimester; History of prior pregnancy with IUGR newborn; Unwanted fertility; [redacted] weeks gestation of pregnancy; and Anemia in pregnancy on their problem list.  Patient reports  pruritus on abdomen .  Contractions: Not present. Vag. Bleeding: None.  Movement: Present. Denies leaking of fluid.   The following portions of the patient's history were reviewed and updated as appropriate: allergies, current medications, past family history, past medical history, past social history, past surgical history and problem list. Problem list updated.  Objective:   Vitals:   01/05/24 0849  BP: 101/66  Pulse: (!) 114  Weight: 154 lb 3.2 oz (69.9 kg)    Fetal Status: Fetal Heart Rate (bpm): 124   Movement: Present     General:  Alert, oriented and cooperative. Patient is in no acute distress.  Skin: Skin is warm and dry. No rash noted.   Cardiovascular: Normal heart rate noted  Respiratory: Normal respiratory effort, no problems with respiration noted  Abdomen: Soft, gravid, appropriate for gestational age. Pain/Pressure: Absent     Pelvic:  Cervical exam deferred        Extremities: Normal range of motion.  Edema: None  Mental Status: Normal mood and affect. Normal behavior. Normal judgment and thought content.   Urinalysis:      Assessment and Plan:  Pregnancy: [id number] at [redacted]w[redacted]d  1. Supervision of high risk pregnancy, antepartum (Primary) Rx: - Tdap vaccine greater than or equal to 7yo IM  2. Language barrier - interpreter present  3. History of preterm delivery - MFM co-management  4. History of prior pregnancy with IUGR newborn - MFM  co-management  5. Alpha thalassemia silent carrier  6. Pruritus Rx: - triamcinolone (KENALOG) 0.025 % ointment; Apply 1 Application topically 2 (two) times daily.  Dispense: 120 g; Refill: 1  7. Anemia affecting pregnancy, antepartum - iron transfusion scheduled next week   Preterm labor symptoms and general obstetric precautions including but not limited to vaginal bleeding, contractions, leaking of fluid and fetal movement were reviewed in detail with the patient. Please refer to After Visit Summary for other counseling recommendations.   Return in about 2 weeks (around 01/19/2024) for [facility].   Brock Bad, MD 01/05/2024

## 2024-01-05 NOTE — Progress Notes (Signed)
Used Mycolog ointment. None left. Would like a refill. Helped itching. Still has rash on abdomen. Having cramping in right leg x once. No other c/o today.

## 2024-01-12 ENCOUNTER — Ambulatory Visit: Payer: Medicaid Other

## 2024-01-18 NOTE — Progress Notes (Unsigned)
   PRENATAL VISIT NOTE  Subjective:  [name] is a 28 y.o. [phone] at [redacted]w[redacted]d being seen today for ongoing prenatal care.  She is currently monitored for the following issues for this high risk pregnancy and has History of preterm delivery; Language barrier; Alpha thalassemia silent carrier; Supervision of high risk pregnancy, antepartum; Vaginal bleeding in pregnancy, second trimester; History of prior pregnancy with IUGR newborn; Unwanted fertility; [redacted] weeks gestation of pregnancy; and Anemia in pregnancy on their problem list.  Patient reports no concerns.  Denies leaking of fluid, vaginal bleeding, contractions.  Endorses fetal movement.  The following portions of the patient's history were reviewed and updated as appropriate: allergies, current medications, past family history, past medical history, past social history, past surgical history and problem list.   Objective:  There were no vitals filed for this visit.  Fetal Status:           General:  Alert, oriented and cooperative. Patient is in no acute distress.  Skin: Skin is warm and dry. No rash noted.   Cardiovascular: Normal heart rate noted  Respiratory: Normal respiratory effort, no problems with respiration noted  Abdomen: Soft, gravid, appropriate for gestational age.        Pelvic: Cervical exam deferred        Extremities: Normal range of motion.     Mental Status: Normal mood and affect. Normal behavior. Normal judgment and thought content.   Assessment and Plan:  Pregnancy: [id number] at [redacted]w[redacted]d 1. Supervision of high risk pregnancy, antepartum (Primary) Patient is doing well, feeling regular fetal movement BP, FHR, FH appropriate  2. [redacted] weeks gestation of pregnancy Anticipatory guidance about next visits/weeks of pregnancy given.   3. History of preterm delivery No ssxs PTL  4. Anemia in pregnancy, third trimester Hgb 8.0 at last visit,  Continue oral iron IV iron ordered?***  5. Language barrier [name]  interpreter utilized during encounter  Preterm labor symptoms and general obstetric precautions including but not limited to vaginal bleeding, contractions, leaking of fluid and fetal movement were reviewed in detail with the patient.  Please refer to After Visit Summary for other counseling recommendations.   No follow-ups on file.  Future Appointments  Date Time Provider Department Center  01/19/2024  9:30 AM Ralene Muskrat, [place] [facility] None  01/26/2024  9:15 AM [facility] NURSE [facility]  01/26/2024  9:30 AM [facility] US1 [facility]  02/02/2024  8:15 AM [facility] None    Ralene Muskrat, PA-C

## 2024-01-19 ENCOUNTER — Encounter: Payer: Self-pay | Admitting: Physician Assistant

## 2024-01-19 ENCOUNTER — Ambulatory Visit (INDEPENDENT_AMBULATORY_CARE_PROVIDER_SITE_OTHER): Payer: Medicaid Other | Admitting: Physician Assistant

## 2024-01-19 VITALS — BP 98/65 | HR 88 | Wt 152.0 lb

## 2024-01-19 DIAGNOSIS — O99013 Anemia complicating pregnancy, third trimester: Secondary | ICD-10-CM

## 2024-01-19 DIAGNOSIS — Z3A31 31 weeks gestation of pregnancy: Secondary | ICD-10-CM

## 2024-01-19 DIAGNOSIS — Z758 Other problems related to medical facilities and other health care: Secondary | ICD-10-CM

## 2024-01-19 DIAGNOSIS — O0990 Supervision of high risk pregnancy, unspecified, unspecified trimester: Secondary | ICD-10-CM | POA: Diagnosis not present

## 2024-01-19 DIAGNOSIS — Z8751 Personal history of pre-term labor: Secondary | ICD-10-CM | POA: Diagnosis not present

## 2024-01-19 DIAGNOSIS — Z603 Acculturation difficulty: Secondary | ICD-10-CM

## 2024-01-20 DIAGNOSIS — Z419 Encounter for procedure for purposes other than remedying health state, unspecified: Secondary | ICD-10-CM | POA: Diagnosis not present

## 2024-01-26 ENCOUNTER — Ambulatory Visit: Payer: Medicaid Other | Attending: Obstetrics

## 2024-01-26 ENCOUNTER — Ambulatory Visit: Payer: Medicaid Other | Admitting: *Deleted

## 2024-01-26 VITALS — BP 99/63 | HR 100

## 2024-01-26 DIAGNOSIS — O09213 Supervision of pregnancy with history of pre-term labor, third trimester: Secondary | ICD-10-CM

## 2024-01-26 DIAGNOSIS — Z8759 Personal history of other complications of pregnancy, childbirth and the puerperium: Secondary | ICD-10-CM | POA: Insufficient documentation

## 2024-01-26 DIAGNOSIS — O99013 Anemia complicating pregnancy, third trimester: Secondary | ICD-10-CM | POA: Diagnosis not present

## 2024-01-26 DIAGNOSIS — D563 Thalassemia minor: Secondary | ICD-10-CM | POA: Diagnosis not present

## 2024-01-26 DIAGNOSIS — O09293 Supervision of pregnancy with other poor reproductive or obstetric history, third trimester: Secondary | ICD-10-CM | POA: Diagnosis not present

## 2024-01-26 DIAGNOSIS — Z3A32 32 weeks gestation of pregnancy: Secondary | ICD-10-CM | POA: Diagnosis not present

## 2024-01-26 DIAGNOSIS — O0990 Supervision of high risk pregnancy, unspecified, unspecified trimester: Secondary | ICD-10-CM | POA: Insufficient documentation

## 2024-02-02 ENCOUNTER — Ambulatory Visit: Payer: Medicaid Other

## 2024-02-02 VITALS — BP 97/61 | HR 97 | Temp 97.6°F | Resp 20 | Ht 59.0 in | Wt 154.4 lb

## 2024-02-02 DIAGNOSIS — D508 Other iron deficiency anemias: Secondary | ICD-10-CM | POA: Diagnosis not present

## 2024-02-02 DIAGNOSIS — O99013 Anemia complicating pregnancy, third trimester: Secondary | ICD-10-CM | POA: Diagnosis not present

## 2024-02-02 DIAGNOSIS — Z3A33 33 weeks gestation of pregnancy: Secondary | ICD-10-CM | POA: Diagnosis not present

## 2024-02-02 MED ORDER — DIPHENHYDRAMINE HCL 25 MG PO CAPS
25.0000 mg | ORAL_CAPSULE | Freq: Once | ORAL | Status: AC
  Administered 2024-02-02: 25 mg via ORAL
  Filled 2024-02-02: qty 1

## 2024-02-02 MED ORDER — ACETAMINOPHEN 325 MG PO TABS
650.0000 mg | ORAL_TABLET | Freq: Once | ORAL | Status: AC
Start: 2024-02-02 — End: 2024-02-02
  Administered 2024-02-02: 650 mg via ORAL
  Filled 2024-02-02: qty 2

## 2024-02-02 MED ORDER — SODIUM CHLORIDE 0.9 % IV SOLN
500.0000 mg | Freq: Once | INTRAVENOUS | Status: AC
  Administered 2024-02-02: 500 mg via INTRAVENOUS
  Filled 2024-02-02: qty 25

## 2024-02-02 MED ORDER — IRON SUCROSE 500 MG IVPB - SIMPLE MED: 500.0000 mg | Freq: Once | INTRAVENOUS | Status: DC

## 2024-02-02 NOTE — Progress Notes (Signed)
 Diagnosis: Iron Deficiency Anemia  Provider:  Chilton Greathouse MD  Procedure: IV Infusion  IV Type: Peripheral, IV Location: L Upper Arm  Venofer (Iron Sucrose), Dose: 500 mg  Infusion Start Time: 0904  Infusion Stop Time: 1321  Post Infusion IV Care: Observation period completed and Peripheral IV Discontinued  Discharge: Condition: Good, Destination: Home . AVS Provided  Performed by:  Rico Ala, LPN

## 2024-02-05 ENCOUNTER — Ambulatory Visit (INDEPENDENT_AMBULATORY_CARE_PROVIDER_SITE_OTHER): Payer: Medicaid Other | Admitting: Obstetrics & Gynecology

## 2024-02-05 ENCOUNTER — Encounter: Payer: Self-pay | Admitting: Obstetrics & Gynecology

## 2024-02-05 VITALS — BP 87/63 | HR 87 | Wt 155.1 lb

## 2024-02-05 DIAGNOSIS — Z758 Other problems related to medical facilities and other health care: Secondary | ICD-10-CM

## 2024-02-05 DIAGNOSIS — Z8759 Personal history of other complications of pregnancy, childbirth and the puerperium: Secondary | ICD-10-CM

## 2024-02-05 DIAGNOSIS — Z3A33 33 weeks gestation of pregnancy: Secondary | ICD-10-CM

## 2024-02-05 DIAGNOSIS — Z603 Acculturation difficulty: Secondary | ICD-10-CM

## 2024-02-05 DIAGNOSIS — Z8751 Personal history of pre-term labor: Secondary | ICD-10-CM

## 2024-02-05 DIAGNOSIS — O99013 Anemia complicating pregnancy, third trimester: Secondary | ICD-10-CM | POA: Diagnosis not present

## 2024-02-05 DIAGNOSIS — Z3009 Encounter for other general counseling and advice on contraception: Secondary | ICD-10-CM

## 2024-02-05 NOTE — Progress Notes (Signed)
   PRENATAL VISIT NOTE  Subjective:  [name] is a 28 y.o. [phone] at [redacted]w[redacted]d being seen today for ongoing prenatal care.  She is currently monitored for the following issues for this high-risk pregnancy and has History of preterm delivery; Language barrier; Alpha thalassemia silent carrier; Vaginal bleeding in pregnancy, second trimester; History of prior pregnancy with IUGR newborn; Unwanted fertility; and Anemia in pregnancy on their problem list.  Patient reports  some lower back pain .  Contractions: Irritability. Vag. Bleeding: None.  Movement: Present. Denies leaking of fluid.   The following portions of the patient's history were reviewed and updated as appropriate: allergies, current medications, past family history, past medical history, past social history, past surgical history and problem list.   Objective:   Vitals:   02/05/24 0941 02/05/24 0950  BP: (!) 88/63 (!) 87/63  Pulse: 91 87  Weight: 155 lb 1.6 oz (70.4 kg)     Fetal Status: Fetal Heart Rate (bpm): 141 Fundal Height: 34 cm Movement: Present     General:  Alert, oriented and cooperative. Patient is in no acute distress.  Skin: Skin is warm and dry. No rash noted.   Cardiovascular: Normal heart rate noted  Respiratory: Normal respiratory effort, no problems with respiration noted  Abdomen: Soft, gravid, appropriate for gestational age.  Pain/Pressure: Present     Pelvic: Cervical exam performed in the presence of a chaperone       closed/thick/high  Extremities: Normal range of motion.  Edema: None  Mental Status: Normal mood and affect. Normal behavior. Normal judgment and thought content.   Assessment and Plan:  Pregnancy: [id number] at [redacted]w[redacted]d 1. Language barrier (Primary) - Ipad interpretor used (She is from [place])  2. History of prior pregnancy with IUGR newborn - scan with MFM  3. Unwanted fertility - signed Medicaid forms 11/2023  4. Anemia during pregnancy in third trimester - s/p iron infusion last week,  on oral supplement  5. History of preterm delivery - PTL precautions reviewed  6. [redacted] weeks gestation of pregnancy   Preterm labor symptoms and general obstetric precautions including but not limited to vaginal bleeding, contractions, leaking of fluid and fetal movement were reviewed in detail with the patient. Please refer to After Visit Summary for other counseling recommendations.   Return in about 2 weeks (around 02/19/2024) for ROB.  Future Appointments  Date Time Provider Department Center  02/23/2024  8:15 AM [facility] None    Allie Bossier, MD

## 2024-02-05 NOTE — Progress Notes (Signed)
 Pt presents for HOB. Pt complains of lower abdominal pain. Pt wants to know if baby is positioned correctly. Pt is having white discharge with no symptoms.

## 2024-02-19 ENCOUNTER — Encounter: Admitting: Obstetrics and Gynecology

## 2024-02-20 ENCOUNTER — Encounter: Payer: Self-pay | Admitting: Obstetrics and Gynecology

## 2024-02-20 ENCOUNTER — Other Ambulatory Visit (HOSPITAL_COMMUNITY)
Admission: RE | Admit: 2024-02-20 | Discharge: 2024-02-20 | Disposition: A | Discharge status: Home / Self Care | Source: Ambulatory Visit | Attending: Obstetrics and Gynecology | Admitting: Obstetrics and Gynecology

## 2024-02-20 ENCOUNTER — Ambulatory Visit (INDEPENDENT_AMBULATORY_CARE_PROVIDER_SITE_OTHER): Admitting: Obstetrics and Gynecology

## 2024-02-20 VITALS — BP 99/65 | HR 89 | Wt 158.0 lb

## 2024-02-20 DIAGNOSIS — Z8751 Personal history of pre-term labor: Secondary | ICD-10-CM

## 2024-02-20 DIAGNOSIS — D563 Thalassemia minor: Secondary | ICD-10-CM | POA: Diagnosis not present

## 2024-02-20 DIAGNOSIS — Z8759 Personal history of other complications of pregnancy, childbirth and the puerperium: Secondary | ICD-10-CM

## 2024-02-20 DIAGNOSIS — Z603 Acculturation difficulty: Secondary | ICD-10-CM | POA: Diagnosis not present

## 2024-02-20 DIAGNOSIS — Z3A36 36 weeks gestation of pregnancy: Secondary | ICD-10-CM

## 2024-02-20 DIAGNOSIS — Z758 Other problems related to medical facilities and other health care: Secondary | ICD-10-CM | POA: Diagnosis not present

## 2024-02-20 DIAGNOSIS — O0990 Supervision of high risk pregnancy, unspecified, unspecified trimester: Secondary | ICD-10-CM | POA: Diagnosis not present

## 2024-02-20 NOTE — Progress Notes (Signed)
   PRENATAL VISIT NOTE  Subjective:  [name] is a 28 y.o. [phone] at [redacted]w[redacted]d being seen today for ongoing prenatal care.  She is currently monitored for the following issues for this low-risk pregnancy and has History of preterm delivery; Language barrier; Alpha thalassemia silent carrier; Vaginal bleeding in pregnancy, second trimester; History of prior pregnancy with IUGR newborn; Unwanted fertility; Anemia in pregnancy; and Supervision of high risk pregnancy, antepartum on their problem list.  Patient doing well with no acute concerns today. She reports occasional contractions.  Contractions: Not present. Vag. Bleeding: None.  Movement: Present. Denies leaking of fluid.   The following portions of the patient's history were reviewed and updated as appropriate: allergies, current medications, past family history, past medical history, past social history, past surgical history and problem list. Problem list updated.  Objective:   Vitals:   02/20/24 1113  BP: 99/65  Pulse: 89  Weight: 158 lb (71.7 kg)    Fetal Status: Fetal Heart Rate (bpm): 140 (Simultaneous filing. User may not have seen previous data.) Fundal Height: 37 cm Movement: Present     General:  Alert, oriented and cooperative. Patient is in no acute distress.  Skin: Skin is warm and dry. No rash noted.   Cardiovascular: Normal heart rate noted  Respiratory: Normal respiratory effort, no problems with respiration noted  Abdomen: Soft, gravid, appropriate for gestational age.  Pain/Pressure: Present     Pelvic: Cervical exam performed. Dilation: Fingertip Effacement (%): 80 Station: -3  Extremities: Normal range of motion.     Mental Status:  Normal mood and affect. Normal behavior. Normal judgment and thought content.   Assessment and Plan:  Pregnancy: [id number] at [redacted]w[redacted]d  1. Supervision of high risk pregnancy, antepartum (Primary) Continue routine prenatal care - Culture, beta strep (group b only) - Cervicovaginal  ancillary only( [place])  2. [redacted] weeks gestation of pregnancy   3. Language barrier Tele interpreter utilized  4. History of prior pregnancy with IUGR newborn Last EFW was at 49 %, fundal height is appropriate  5. History of preterm delivery No s/sx of preterm labor  6. Alpha thalassemia silent carrier   Preterm labor symptoms and general obstetric precautions including but not limited to vaginal bleeding, contractions, leaking of fluid and fetal movement were reviewed in detail with the patient.  Please refer to After Visit Summary for other counseling recommendations.   Return in about 1 week (around 02/27/2024) for ROB, in person.   Mariel Aloe, MD Faculty Attending Center for [facility]

## 2024-02-21 LAB — CERVICOVAGINAL ANCILLARY ONLY
Chlamydia: NEGATIVE
Comment: NEGATIVE
Comment: NEGATIVE
Comment: NORMAL
Neisseria Gonorrhea: NEGATIVE
Trichomonas: NEGATIVE

## 2024-02-23 ENCOUNTER — Ambulatory Visit

## 2024-02-23 VITALS — BP 96/62 | HR 95 | Temp 97.4°F | Resp 16 | Ht 59.0 in | Wt 157.4 lb

## 2024-02-23 DIAGNOSIS — Z3A36 36 weeks gestation of pregnancy: Secondary | ICD-10-CM | POA: Diagnosis not present

## 2024-02-23 DIAGNOSIS — O99013 Anemia complicating pregnancy, third trimester: Secondary | ICD-10-CM

## 2024-02-23 DIAGNOSIS — D508 Other iron deficiency anemias: Secondary | ICD-10-CM | POA: Diagnosis not present

## 2024-02-23 MED ORDER — DIPHENHYDRAMINE HCL 25 MG PO CAPS
25.0000 mg | ORAL_CAPSULE | Freq: Once | ORAL | Status: AC
  Administered 2024-02-23: 25 mg via ORAL
  Filled 2024-02-23: qty 1

## 2024-02-23 MED ORDER — ACETAMINOPHEN 325 MG PO TABS
650.0000 mg | ORAL_TABLET | Freq: Once | ORAL | Status: AC
  Administered 2024-02-23: 650 mg via ORAL
  Filled 2024-02-23: qty 2

## 2024-02-23 MED ORDER — IRON SUCROSE 500 MG IVPB - SIMPLE MED
500.0000 mg | Freq: Once | INTRAVENOUS | Status: AC
  Administered 2024-02-23: 500 mg via INTRAVENOUS
  Filled 2024-02-23: qty 275

## 2024-02-23 NOTE — Progress Notes (Signed)
 Diagnosis: Iron Deficiency Anemia  Provider:  Chilton Greathouse MD  Procedure: IV Infusion  IV Type: Peripheral, IV Location: L Antecubital  Venofer (Iron Sucrose), Dose: 500 mg  Infusion Start Time: 0858  Infusion Stop Time: 1244  Post Infusion IV Care: Observation period completed and Peripheral IV Discontinued  Discharge: Condition: Good, Destination: Home . AVS Declined  Performed by:  Rico Ala, LPN

## 2024-02-24 LAB — CULTURE, BETA STREP (GROUP B ONLY): Strep Gp B Culture: NEGATIVE

## 2024-03-01 ENCOUNTER — Ambulatory Visit (INDEPENDENT_AMBULATORY_CARE_PROVIDER_SITE_OTHER): Admitting: Obstetrics and Gynecology

## 2024-03-01 ENCOUNTER — Encounter: Payer: Self-pay | Admitting: Obstetrics and Gynecology

## 2024-03-01 VITALS — BP 100/66 | HR 107 | Wt 160.0 lb

## 2024-03-01 DIAGNOSIS — Z758 Other problems related to medical facilities and other health care: Secondary | ICD-10-CM

## 2024-03-01 DIAGNOSIS — Z3A37 37 weeks gestation of pregnancy: Secondary | ICD-10-CM

## 2024-03-01 DIAGNOSIS — Z603 Acculturation difficulty: Secondary | ICD-10-CM | POA: Diagnosis not present

## 2024-03-01 DIAGNOSIS — Z8759 Personal history of other complications of pregnancy, childbirth and the puerperium: Secondary | ICD-10-CM | POA: Diagnosis not present

## 2024-03-01 DIAGNOSIS — O0990 Supervision of high risk pregnancy, unspecified, unspecified trimester: Secondary | ICD-10-CM | POA: Diagnosis not present

## 2024-03-01 NOTE — Progress Notes (Signed)
   PRENATAL VISIT NOTE  Subjective:  [name] is a 28 y.o. [phone] at 107w3d being seen today for ongoing prenatal care.  She is currently monitored for the following issues for this low-risk pregnancy and has History of preterm delivery; Language barrier; Alpha thalassemia silent carrier; Vaginal bleeding in pregnancy, second trimester; History of prior pregnancy with IUGR newborn; Unwanted fertility; Anemia in pregnancy; and Supervision of high risk pregnancy, antepartum on their problem list.  Patient reports  pelvic pressure .  Contractions: Not present. Vag. Bleeding: None.  Movement: Present. Denies leaking of fluid.   The following portions of the patient's history were reviewed and updated as appropriate: allergies, current medications, past family history, past medical history, past social history, past surgical history and problem list.   Objective:   Vitals:   03/01/24 0915  BP: 100/66  Pulse: (!) 107  Weight: 160 lb (72.6 kg)    Fetal Status: Fetal Heart Rate (bpm): 140 Fundal Height: 38 cm Movement: Present  Presentation: Vertex  General:  Alert, oriented and cooperative. Patient is in no acute distress.  Skin: Skin is warm and dry. No rash noted.   Cardiovascular: Normal heart rate noted  Respiratory: Normal respiratory effort, no problems with respiration noted  Abdomen: Soft, gravid, appropriate for gestational age.  Pain/Pressure: Absent     Pelvic: Cervical exam performed in the presence of a chaperone Dilation: 1 Effacement (%): 50 Station: -3  Extremities: Normal range of motion.  Edema: None  Mental Status: Normal mood and affect. Normal behavior. Normal judgment and thought content.   Assessment and Plan:  Pregnancy: [id number] at [redacted]w[redacted]d 1. Supervision of high risk pregnancy, antepartum (Primary) BP and FHR normal Doing well, feeling regular movement    2. [redacted] weeks gestation of pregnancy Labor precautions discussed Supportive measures regarding pelvic  pressure  3. Language barrier [employer] interpreter used during visit  4. History of prior pregnancy with IUGR newborn   Term labor symptoms and general obstetric precautions including but not limited to vaginal bleeding, contractions, leaking of fluid and fetal movement were reviewed in detail with the patient. Please refer to After Visit Summary for other counseling recommendations.   Return in about 1 week (around 03/08/2024) for OB VISIT (MD or APP).    Susi Eric, FNP

## 2024-03-01 NOTE — Progress Notes (Signed)
 C/O stretching feeling in groin area. Wants cx check today.

## 2024-03-02 DIAGNOSIS — Z419 Encounter for procedure for purposes other than remedying health state, unspecified: Secondary | ICD-10-CM | POA: Diagnosis not present

## 2024-03-05 ENCOUNTER — Encounter: Admitting: Obstetrics and Gynecology

## 2024-03-07 ENCOUNTER — Ambulatory Visit: Admitting: Obstetrics and Gynecology

## 2024-03-07 VITALS — BP 100/64 | HR 87 | Wt 161.4 lb

## 2024-03-07 DIAGNOSIS — Z603 Acculturation difficulty: Secondary | ICD-10-CM

## 2024-03-07 DIAGNOSIS — O0990 Supervision of high risk pregnancy, unspecified, unspecified trimester: Secondary | ICD-10-CM

## 2024-03-07 DIAGNOSIS — Z3A38 38 weeks gestation of pregnancy: Secondary | ICD-10-CM | POA: Diagnosis not present

## 2024-03-07 DIAGNOSIS — Z758 Other problems related to medical facilities and other health care: Secondary | ICD-10-CM | POA: Diagnosis not present

## 2024-03-07 NOTE — Progress Notes (Signed)
   PRENATAL VISIT NOTE  Subjective:  [name] is a 28 y.o. [phone] at [redacted]w[redacted]d being seen today for ongoing prenatal care.  She is currently monitored for the following issues for this low-risk pregnancy and has History of preterm delivery; Language barrier; Alpha thalassemia silent carrier; Vaginal bleeding in pregnancy, second trimester; History of prior pregnancy with IUGR newborn; Unwanted fertility; Anemia in pregnancy; and Supervision of high risk pregnancy, antepartum on their problem list.  Patient reports  irregular contractions .  Contractions: Irritability. Vag. Bleeding: None.  Movement: Present. Denies leaking of fluid.   The following portions of the patient's history were reviewed and updated as appropriate: allergies, current medications, past family history, past medical history, past social history, past surgical history and problem list.   Objective:   Vitals:   03/07/24 1056  BP: 100/64  Pulse: 87  Weight: 161 lb 6.4 oz (73.2 kg)    Fetal Status: Fetal Heart Rate (bpm): 146 Fundal Height: 39 cm Movement: Present     General:  Alert, oriented and cooperative. Patient is in no acute distress.  Skin: Skin is warm and dry. No rash noted.   Cardiovascular: Normal heart rate noted  Respiratory: Normal respiratory effort, no problems with respiration noted  Abdomen: Soft, gravid, appropriate for gestational age.  Pain/Pressure: Absent     Pelvic: Cervical exam performed in the presence of a chaperone Dilation: 1 Effacement (%): 50 Station: -3  Extremities: Normal range of motion.  Edema: None  Mental Status: Normal mood and affect. Normal behavior. Normal judgment and thought content.   Assessment and Plan:  Pregnancy: [id number] at [redacted]w[redacted]d 1. Supervision of high risk pregnancy, antepartum (Primary) BP and FHR normal Doing well, feeling regular movement    2. Language barrier Does not desire interpreter today  3. [redacted] weeks gestation of pregnancy Labor precautions  discussed Discussed option for membrane sweep next visit   Term labor symptoms and general obstetric precautions including but not limited to vaginal bleeding, contractions, leaking of fluid and fetal movement were reviewed in detail with the patient. Please refer to After Visit Summary for other counseling recommendations.   Return in about 1 week (around 03/14/2024) for OB VISIT (MD or APP).    Susi Eric, FNP

## 2024-03-07 NOTE — Progress Notes (Signed)
 Pt presents for ROB visit. Requesting cervical check.

## 2024-03-19 ENCOUNTER — Telehealth (HOSPITAL_COMMUNITY): Payer: Self-pay | Admitting: *Deleted

## 2024-03-19 ENCOUNTER — Ambulatory Visit: Admitting: Obstetrics & Gynecology

## 2024-03-19 ENCOUNTER — Encounter (HOSPITAL_COMMUNITY): Payer: Self-pay | Admitting: *Deleted

## 2024-03-19 VITALS — BP 101/68 | HR 84 | Wt 163.0 lb

## 2024-03-19 DIAGNOSIS — O0990 Supervision of high risk pregnancy, unspecified, unspecified trimester: Secondary | ICD-10-CM | POA: Diagnosis not present

## 2024-03-19 DIAGNOSIS — Z758 Other problems related to medical facilities and other health care: Secondary | ICD-10-CM

## 2024-03-19 DIAGNOSIS — Z3009 Encounter for other general counseling and advice on contraception: Secondary | ICD-10-CM | POA: Diagnosis not present

## 2024-03-19 DIAGNOSIS — Z603 Acculturation difficulty: Secondary | ICD-10-CM | POA: Diagnosis not present

## 2024-03-19 NOTE — Telephone Encounter (Signed)
 Preadmission screen Interpreter number [phone]

## 2024-03-19 NOTE — Progress Notes (Signed)
   PRENATAL VISIT NOTE  Subjective:  [name] is a 28 y.o. [phone] at [redacted]w[redacted]d being seen today for ongoing prenatal care.  She is currently monitored for the following issues for this high-risk pregnancy and has History of preterm delivery; Language barrier; Alpha thalassemia silent carrier; Vaginal bleeding in pregnancy, second trimester; History of prior pregnancy with IUGR newborn; Unwanted fertility; Anemia in pregnancy; and Supervision of high risk pregnancy, antepartum on their problem list.  Patient reports occasional contractions.  Contractions: Irregular.  .  Movement: Present. Denies leaking of fluid.   The following portions of the patient's history were reviewed and updated as appropriate: allergies, current medications, past family history, past medical history, past social history, past surgical history and problem list.   Objective:   Vitals:   03/19/24 0906  BP: 101/68  Pulse: 84  Weight: 163 lb (73.9 kg)    Fetal Status: Fetal Heart Rate (bpm): 154   Movement: Present  Presentation: Vertex  General:  Alert, oriented and cooperative. Patient is in no acute distress.  Skin: Skin is warm and dry. No rash noted.   Cardiovascular: Normal heart rate noted  Respiratory: Normal respiratory effort, no problems with respiration noted  Abdomen: Soft, gravid, appropriate for gestational age.  Pain/Pressure: Present     Pelvic: Cervical exam performed in the presence of a chaperone Dilation: 2 Effacement (%): 40 Station: -3  Extremities: Normal range of motion.  Edema: None  Mental Status: Normal mood and affect. Normal behavior. Normal judgment and thought content.   Assessment and Plan:  Pregnancy: [id number] at [redacted]w[redacted]d 1. Supervision of high risk pregnancy, antepartum (Primary) - Patient is scheduled for induction of labor due to post-dates pregnancy. Patient will return for a final prenatal visit prior to induction to ensure maternal and fetal well being. Patient was counseled on  where to present for induction. - A virtual translator was used during this visit to facilitate communication in the [employer].  Term labor symptoms and general obstetric precautions including but not limited to vaginal bleeding, contractions, leaking of fluid and fetal movement were reviewed in detail with the patient. Please refer to After Visit Summary for other counseling recommendations.   Return in about 3 days (around 03/22/2024).  Future Appointments  Date Time Provider Department Center  03/22/2024  8:35 AM Izell Marsh, MD [facility] None  03/27/2024  6:30 AM [facility] SCHED ROOM [facility] None    Onnie Bilis, MD

## 2024-03-21 ENCOUNTER — Encounter (HOSPITAL_COMMUNITY): Payer: Self-pay | Admitting: Obstetrics and Gynecology

## 2024-03-21 ENCOUNTER — Inpatient Hospital Stay (HOSPITAL_COMMUNITY): Admitting: Anesthesiology

## 2024-03-21 ENCOUNTER — Inpatient Hospital Stay (HOSPITAL_COMMUNITY)
Admission: AD | Admit: 2024-03-21 | Discharge: 2024-03-23 | DRG: 768 | Disposition: A | Discharge status: Home / Self Care | Attending: Family Medicine | Admitting: Family Medicine

## 2024-03-21 DIAGNOSIS — O4202 Full-term premature rupture of membranes, onset of labor within 24 hours of rupture: Secondary | ICD-10-CM | POA: Diagnosis not present

## 2024-03-21 DIAGNOSIS — Z148 Genetic carrier of other disease: Secondary | ICD-10-CM

## 2024-03-21 DIAGNOSIS — D62 Acute posthemorrhagic anemia: Secondary | ICD-10-CM | POA: Diagnosis not present

## 2024-03-21 DIAGNOSIS — Z3A40 40 weeks gestation of pregnancy: Secondary | ICD-10-CM

## 2024-03-21 DIAGNOSIS — Z603 Acculturation difficulty: Secondary | ICD-10-CM | POA: Diagnosis not present

## 2024-03-21 DIAGNOSIS — O26893 Other specified pregnancy related conditions, third trimester: Secondary | ICD-10-CM | POA: Diagnosis not present

## 2024-03-21 DIAGNOSIS — O99214 Obesity complicating childbirth: Secondary | ICD-10-CM | POA: Diagnosis not present

## 2024-03-21 DIAGNOSIS — Z302 Encounter for sterilization: Secondary | ICD-10-CM

## 2024-03-21 DIAGNOSIS — O9081 Anemia of the puerperium: Secondary | ICD-10-CM | POA: Diagnosis not present

## 2024-03-21 DIAGNOSIS — O480 Post-term pregnancy: Secondary | ICD-10-CM | POA: Diagnosis not present

## 2024-03-21 LAB — CBC
HCT: 38.9 % (ref 36.0–46.0)
HCT: 37.9 % (ref 36.0–46.0)
Hemoglobin: 12.2 g/dL (ref 12.0–15.0)
Hemoglobin: 11.3 g/dL — ABNORMAL LOW (ref 12.0–15.0)
MCH: 24.5 pg — ABNORMAL LOW (ref 26.0–34.0)
MCH: 24 pg — ABNORMAL LOW (ref 26.0–34.0)
MCHC: 31.4 g/dL (ref 30.0–36.0)
MCHC: 29.8 g/dL — ABNORMAL LOW (ref 30.0–36.0)
MCV: 78.1 fL — ABNORMAL LOW (ref 80.0–100.0)
MCV: 80.6 fL (ref 80.0–100.0)
Platelets: 282 10*3/uL (ref 150–400)
Platelets: 264 10*3/uL (ref 150–400)
RBC: 4.98 MIL/uL (ref 3.87–5.11)
RBC: 4.7 MIL/uL (ref 3.87–5.11)
RDW: 22.4 % — ABNORMAL HIGH (ref 11.5–15.5)
RDW: 22.4 % — ABNORMAL HIGH (ref 11.5–15.5)
WBC: 9.3 10*3/uL (ref 4.0–10.5)
WBC: 15 10*3/uL — ABNORMAL HIGH (ref 4.0–10.5)
nRBC: 0 % (ref 0.0–0.2)
nRBC: 0 % (ref 0.0–0.2)

## 2024-03-21 LAB — TYPE AND SCREEN
ABO/RH(D): AB POS
Antibody Screen: NEGATIVE

## 2024-03-21 LAB — POCT FERN TEST: POCT Fern Test: POSITIVE

## 2024-03-21 LAB — RPR: RPR Ser Ql: NONREACTIVE

## 2024-03-21 MED ORDER — FENTANYL CITRATE (PF) 100 MCG/2ML IJ SOLN
50.0000 ug | INTRAMUSCULAR | Status: DC | PRN
  Filled 2024-03-21: qty 2

## 2024-03-21 MED ORDER — MEASLES, MUMPS & RUBELLA VAC IJ SOLR: 0.5000 mL | Freq: Once | INTRAMUSCULAR | Status: DC

## 2024-03-21 MED ORDER — IBUPROFEN 800 MG PO TABS
800.0000 mg | ORAL_TABLET | Freq: Three times a day (TID) | ORAL | Status: DC
  Administered 2024-03-21 – 2024-03-22 (×2): 800 mg via ORAL
  Filled 2024-03-21 (×3): qty 1

## 2024-03-21 MED ORDER — LIDOCAINE HCL (PF) 1 % IJ SOLN: 30.0000 mL | INTRAMUSCULAR | Status: DC | PRN

## 2024-03-21 MED ORDER — TRANEXAMIC ACID-NACL 1000-0.7 MG/100ML-% IV SOLN
INTRAVENOUS | Status: AC
Start: 2024-03-21 — End: 2024-03-21
  Administered 2024-03-21: 1000 mg via INTRAVENOUS
  Filled 2024-03-21: qty 100

## 2024-03-21 MED ORDER — ACETAMINOPHEN 325 MG PO TABS: 650.0000 mg | ORAL_TABLET | ORAL | Status: DC | PRN

## 2024-03-21 MED ORDER — LIDOCAINE HCL (PF) 1 % IJ SOLN
INTRAMUSCULAR | Status: DC | PRN
  Administered 2024-03-21: 5 mL via EPIDURAL
  Administered 2024-03-21: 3 mL via EPIDURAL
  Administered 2024-03-21: 2 mL via EPIDURAL

## 2024-03-21 MED ORDER — OXYCODONE-ACETAMINOPHEN 5-325 MG PO TABS
1.0000 | ORAL_TABLET | ORAL | Status: DC | PRN
  Administered 2024-03-21: 1 via ORAL
  Filled 2024-03-21: qty 1

## 2024-03-21 MED ORDER — SOD CITRATE-CITRIC ACID 500-334 MG/5ML PO SOLN: 30.0000 mL | ORAL | Status: DC | PRN

## 2024-03-21 MED ORDER — SENNOSIDES-DOCUSATE SODIUM 8.6-50 MG PO TABS: 2.0000 | ORAL_TABLET | ORAL | Status: DC

## 2024-03-21 MED ORDER — LACTATED RINGERS IV SOLN: INTRAVENOUS | Status: DC

## 2024-03-21 MED ORDER — OXYTOCIN-SODIUM CHLORIDE 30-0.9 UT/500ML-% IV SOLN
2.5000 [IU]/h | INTRAVENOUS | Status: DC
  Filled 2024-03-21: qty 500

## 2024-03-21 MED ORDER — TRANEXAMIC ACID-NACL 1000-0.7 MG/100ML-% IV SOLN: 1000.0000 mg | INTRAVENOUS | Status: AC

## 2024-03-21 MED ORDER — ONDANSETRON HCL 4 MG/2ML IJ SOLN: 4.0000 mg | Freq: Four times a day (QID) | INTRAMUSCULAR | Status: DC | PRN

## 2024-03-21 MED ORDER — FENTANYL-BUPIVACAINE-NACL 0.5-0.125-0.9 MG/250ML-% EP SOLN
12.0000 mL/h | EPIDURAL | Status: DC | PRN
  Administered 2024-03-21: 12 mL/h via EPIDURAL
  Filled 2024-03-21: qty 250

## 2024-03-21 MED ORDER — OXYCODONE HCL 5 MG PO TABS: 10.0000 mg | ORAL_TABLET | ORAL | Status: DC | PRN

## 2024-03-21 MED ORDER — SODIUM CHLORIDE 0.9 % IV SOLN: 250.0000 mL | INTRAVENOUS | Status: DC | PRN

## 2024-03-21 MED ORDER — OXYTOCIN BOLUS FROM INFUSION
333.0000 mL | Freq: Once | INTRAVENOUS | Status: AC
  Administered 2024-03-21: 333 mL via INTRAVENOUS

## 2024-03-21 MED ORDER — LACTATED RINGERS IV SOLN: 500.0000 mL | INTRAVENOUS | Status: DC | PRN

## 2024-03-21 MED ORDER — PHENYLEPHRINE 80 MCG/ML (10ML) SYRINGE FOR IV PUSH (FOR BLOOD PRESSURE SUPPORT): 80.0000 ug | PREFILLED_SYRINGE | INTRAVENOUS | Status: DC | PRN

## 2024-03-21 MED ORDER — WITCH HAZEL-GLYCERIN EX PADS: 1.0000 | MEDICATED_PAD | CUTANEOUS | Status: DC | PRN

## 2024-03-21 MED ORDER — BENZOCAINE-MENTHOL 20-0.5 % EX AERO: 1.0000 | INHALATION_SPRAY | CUTANEOUS | Status: DC | PRN

## 2024-03-21 MED ORDER — ONDANSETRON HCL 4 MG PO TABS: 4.0000 mg | ORAL_TABLET | ORAL | Status: DC | PRN

## 2024-03-21 MED ORDER — METHYLERGONOVINE MALEATE 0.2 MG/ML IJ SOLN: 0.2000 mg | Freq: Once | INTRAMUSCULAR | Status: DC

## 2024-03-21 MED ORDER — SODIUM CHLORIDE 0.9% FLUSH: 3.0000 mL | INTRAVENOUS | Status: DC | PRN

## 2024-03-21 MED ORDER — OXYCODONE HCL 5 MG PO TABS: 5.0000 mg | ORAL_TABLET | ORAL | Status: DC | PRN

## 2024-03-21 MED ORDER — DIPHENHYDRAMINE HCL 25 MG PO CAPS: 25.0000 mg | ORAL_CAPSULE | Freq: Four times a day (QID) | ORAL | Status: DC | PRN

## 2024-03-21 MED ORDER — TETANUS-DIPHTH-ACELL PERTUSSIS 5-2.5-18.5 LF-MCG/0.5 IM SUSY: 0.5000 mL | PREFILLED_SYRINGE | Freq: Once | INTRAMUSCULAR | Status: DC

## 2024-03-21 MED ORDER — ONDANSETRON HCL 4 MG/2ML IJ SOLN: 4.0000 mg | INTRAMUSCULAR | Status: DC | PRN

## 2024-03-21 MED ORDER — COCONUT OIL OIL: 1.0000 | TOPICAL_OIL | Status: DC | PRN

## 2024-03-21 MED ORDER — PRENATAL MULTIVITAMIN CH
1.0000 | ORAL_TABLET | Freq: Every day | ORAL | Status: DC
  Filled 2024-03-21: qty 1

## 2024-03-21 MED ORDER — SIMETHICONE 80 MG PO CHEW: 80.0000 mg | CHEWABLE_TABLET | ORAL | Status: DC | PRN

## 2024-03-21 MED ORDER — FENTANYL CITRATE (PF) 100 MCG/2ML IJ SOLN
100.0000 ug | Freq: Once | INTRAMUSCULAR | Status: AC
  Administered 2024-03-21: 100 ug via INTRAVENOUS

## 2024-03-21 MED ORDER — DIBUCAINE (PERIANAL) 1 % EX OINT: 1.0000 | TOPICAL_OINTMENT | CUTANEOUS | Status: DC | PRN

## 2024-03-21 MED ORDER — SODIUM CHLORIDE 0.9% FLUSH
3.0000 mL | Freq: Two times a day (BID) | INTRAVENOUS | Status: DC
  Administered 2024-03-21: 3 mL via INTRAVENOUS

## 2024-03-21 MED ORDER — EPHEDRINE 5 MG/ML INJ: 10.0000 mg | INTRAVENOUS | Status: DC | PRN

## 2024-03-21 MED ORDER — LACTATED RINGERS IV SOLN
500.0000 mL | Freq: Once | INTRAVENOUS | Status: AC
  Administered 2024-03-21: 500 mL via INTRAVENOUS

## 2024-03-21 MED ORDER — DIPHENHYDRAMINE HCL 50 MG/ML IJ SOLN: 12.5000 mg | INTRAMUSCULAR | Status: DC | PRN

## 2024-03-21 MED ORDER — OXYCODONE-ACETAMINOPHEN 5-325 MG PO TABS: 2.0000 | ORAL_TABLET | ORAL | Status: DC | PRN

## 2024-03-21 MED ORDER — LACTATED RINGERS IV BOLUS
1000.0000 mL | Freq: Once | INTRAVENOUS | Status: AC
  Administered 2024-03-21: 1000 mL via INTRAVENOUS

## 2024-03-21 MED ORDER — METHYLERGONOVINE MALEATE 0.2 MG/ML IJ SOLN
INTRAMUSCULAR | Status: AC
  Filled 2024-03-21: qty 1

## 2024-03-21 NOTE — Anesthesia Procedure Notes (Signed)
 Epidural Patient location during procedure: OB Start time: 03/21/2024 11:05 AM End time: 03/21/2024 11:12 AM  Staffing Anesthesiologist: Erin Havers, MD Performed: anesthesiologist   Preanesthetic Checklist Completed: patient identified, IV checked, risks and benefits discussed, monitors and equipment checked, pre-op evaluation and timeout performed  Epidural Patient position: sitting Prep: DuraPrep Patient monitoring: blood pressure and continuous pulse ox Approach: midline Location: L3-L4 Injection technique: LOR air  Needle:  Needle type: Tuohy  Needle gauge: 17 G Needle length: 9 cm Needle insertion depth: 6 cm Catheter size: 19 Gauge Catheter at skin depth: 11 cm Test dose: negative and Other (1% Lidocaine )  Additional Notes Patient identified.  Risk benefits discussed including failed block, incomplete pain control, headache, nerve damage, paralysis, blood pressure changes, nausea, vomiting, reactions to medication both toxic or allergic, and postpartum back pain.  Patient expressed understanding and wished to proceed.  All questions were answered.  Sterile technique used throughout procedure and epidural site dressed with sterile barrier dressing. No paresthesia or other complications noted. The patient did not experience any signs of intravascular injection such as tinnitus or metallic taste in mouth nor signs of intrathecal spread such as rapid motor block. Please see nursing notes for vital signs. Reason for block:procedure for pain

## 2024-03-21 NOTE — Discharge Summary (Signed)
 Postpartum Discharge Summary  Date of Service updated***     Patient Name: [name] DOB: June 19, 1996 MRN: [id number]  Date of admission: 03/21/2024 Delivery date:  Delivering provider: LEFTWICH-KIRBY, Edwina Gram A Date of discharge: 03/21/2024  Admitting diagnosis: Normal labor [O80, Z37.9] Intrauterine pregnancy: [redacted]w[redacted]d     Secondary diagnosis:  Principal Problem:   SVD (spontaneous vaginal delivery)  Additional problems: ***    Discharge diagnosis: Term Pregnancy Delivered                                              Post partum procedures:{Postpartum procedures:23558} Augmentation: N/A Complications: {OB Labor/Delivery Complications:20784}  Hospital course: Onset of Labor With Vaginal Delivery      28 y.o. yo [phone] at [redacted]w[redacted]d was admitted for SROM and latent labor on 03/21/2024. Labor course was shoulder dystocia (40 seconds) Membrane Rupture Time/Date: 8:00 AM,03/21/2024  Delivery Method:  Operative Delivery:N/A Episiotomy: None Lacerations:    Patient had a postpartum course complicated by ***.  She is ambulating, tolerating a regular diet, passing flatus, and urinating well. Patient is discharged home in stable condition on 03/21/24.  Newborn Data: Birth date:  Birth time:  Gender:Female Living status:  Apgars: ,  Weight:   Magnesium  Sulfate received: {Mag received:30440022} BMZ received: No Rhophylac:N/A MMR:N/A T-DaP:Given prenatally Flu: Yes RSV Vaccine received: No Transfusion:{Transfusion received:30440034}  Immunizations received: Immunization History  Administered Date(s) Administered   Influenza, Seasonal, Injecte, Preservative Fre 09/24/2023   Influenza,inj,Quad PF,6+ Mos 07/23/2015, 12/27/2019, 07/29/2020   PFIZER(Purple Top)SARS-COV-2 Vaccination 07/25/2020, 08/15/2020   Tdap 08/27/2015, 01/05/2024    Physical exam  Vitals:   03/21/24 1141 03/21/24 1146 03/21/24 1151 03/21/24 1201  BP: 107/66 109/71 113/74 111/70  Pulse: 74 74 80 80  Resp:   16    Temp:   97.7 F (36.5 C)   TempSrc:   Oral   SpO2: 98% 100%    Weight:      Height:       General: {Exam; general:21111117} Lochia: {Desc; appropriate/inappropriate:30686::"appropriate"} Uterine Fundus: {Desc; firm/soft:30687} Incision: {Exam; incision:21111123} DVT Evaluation: {Exam; dvt:2111122} Labs: Lab Results  Component Value Date   WBC 9.3 03/21/2024   HGB 12.2 03/21/2024   HCT 38.9 03/21/2024   MCV 78.1 (L) 03/21/2024   PLT 282 03/21/2024      Latest Ref Rng & Units 08/11/2023    9:37 AM  CMP  Glucose 70 - 99 mg/dL 77   BUN 6 - 20 mg/dL 7   Creatinine 0.27 - 2.53 mg/dL 6.64   Sodium 403 - 474 mmol/L 139   Potassium 3.5 - 5.2 mmol/L 4.5   Chloride 96 - 106 mmol/L 102   CO2 20 - 29 mmol/L 21   Calcium  8.7 - 10.2 mg/dL 9.4   Total Protein 6.0 - 8.5 g/dL 6.8   Total Bilirubin 0.0 - 1.2 mg/dL <2.5   Alkaline Phos 44 - 121 IU/L 68   AST 0 - 40 IU/L 17   ALT 0 - 32 IU/L 18    Edinburgh Score:    07/29/2020    9:29 AM  Edinburgh Postnatal Depression Scale Screening Tool  I have been able to laugh and see the funny side of things. 0  I have looked forward with enjoyment to things. 0  I have blamed myself unnecessarily when things went wrong. 0  I have been anxious or worried  for no good reason. 0  I have felt scared or panicky for no good reason. 0  Things have been getting on top of me. 0  I have been so unhappy that I have had difficulty sleeping. 0  I have felt sad or miserable. 0  I have been so unhappy that I have been crying. 0  The thought of harming myself has occurred to me. 0  Edinburgh Postnatal Depression Scale Total 0   No data recorded  After visit meds:  Allergies as of 03/21/2024   No Known Allergies   Med Rec must be completed prior to using this [facility]***        Discharge home in stable condition Infant Feeding: {Baby feeding:23562} Infant Disposition:{CHL IP OB HOME WITH WUJWJX:91478} Discharge instruction: per After Visit  Summary and Postpartum booklet. Activity: Advance as tolerated. Pelvic rest for 6 weeks.  Diet: {OB GNFA:21308657} Future Appointments: Future Appointments  Date Time Provider Department Center  03/22/2024  8:35 AM Izell Marsh, MD [facility] None  03/27/2024  6:30 AM [facility] SCHED ROOM [facility] None   Follow up Visit:  Message sent to [facility] 5/1 Please schedule this patient for a In person postpartum visit in 6 weeks with the following provider: Any provider. Additional Postpartum F/U: incision check (s/p BTL)   Low risk pregnancy complicated by:  anemia Delivery mode:   SVD Anticipated Birth Control:  BTL done [facility]   03/21/2024 Melanie Spires, MD

## 2024-03-21 NOTE — Anesthesia Preprocedure Evaluation (Signed)
 Anesthesia Evaluation  Patient identified by MRN, date of birth, ID band Patient awake    Reviewed: Allergy & Precautions, NPO status , Patient's Chart, lab work & pertinent test results  Airway Mallampati: II  TM Distance: >3 FB Neck ROM: Full    Dental  (+) Teeth Intact, Dental Advisory Given   Pulmonary neg pulmonary ROS   Pulmonary exam normal breath sounds clear to auscultation       Cardiovascular negative cardio ROS Normal cardiovascular exam Rhythm:Regular Rate:Normal     Neuro/Psych negative neurological ROS  negative psych ROS   GI/Hepatic negative GI ROS, Neg liver ROS,,,  Endo/Other  Obesity   Renal/GU negative Renal ROS     Musculoskeletal negative musculoskeletal ROS (+)    Abdominal   Peds  Hematology negative hematology ROS (+) Plt 282k   Anesthesia Other Findings Day of surgery medications reviewed with the patient.  Reproductive/Obstetrics (+) Pregnancy                              Anesthesia Physical Anesthesia Plan  ASA: 2  Anesthesia Plan: Epidural   Post-op Pain Management:    Induction:   PONV Risk Score and Plan: 2 and Treatment may vary due to age or medical condition  Airway Management Planned: Natural Airway  Additional Equipment:   Intra-op Plan:   Post-operative Plan:   Informed Consent: I have reviewed the patients History and Physical, chart, labs and discussed the procedure including the risks, benefits and alternatives for the proposed anesthesia with the patient or authorized representative who has indicated his/her understanding and acceptance.     Dental advisory given and Interpreter used for interview  Plan Discussed with:   Anesthesia Plan Comments: (Patient identified. Risks/Benefits/Options discussed with patient including but not limited to bleeding, infection, nerve damage, paralysis, failed block, incomplete pain control,  headache, blood pressure changes, nausea, vomiting, reactions to medication both or allergic, itching and postpartum back pain. Confirmed with bedside nurse the patient's most recent platelet count. Confirmed with patient that they are not currently taking any anticoagulation, have any bleeding history or any family history of bleeding disorders. Patient expressed understanding and wished to proceed. All questions were answered. )         Anesthesia Quick Evaluation

## 2024-03-21 NOTE — Lactation Note (Signed)
 This note was copied from a baby's chart. Lactation Consultation Note  Patient Name: [name] Date: 03/21/2024 Age:29 hours Reason for consult: Initial assessment  P4- MOB is formula feeding only. Please let LC team know if MOB is requesting assistance at any time.  Feeding Mother's Current Feeding Choice: Formula Nipple Type: Slow - flow  Consult Status Consult Status: Complete Date: 03/21/24    Vernette Goo BS, IBCLC 03/21/2024, 3:38 PM

## 2024-03-21 NOTE — H&P (Signed)
 OBSTETRIC ADMISSION HISTORY AND PHYSICAL  [name] is a 28 y.o. female [id number] with IUP at [redacted]w[redacted]d (dated by 8 wk US , Estimated Date of Delivery: 03/19/24) presenting in latent labor with SROM at 0800.   She reports +FMs, no VB, no blurry vision, headaches or peripheral edema, and RUQ pain.    She plans on both feeding. She request postpartum tubal ligation for birth control.  She received her prenatal [facility] at [facility]   Prenatal History/Complications:  - Anemia of pregnancy  Past Medical History: Past Medical History:  Diagnosis Date   Alpha thalassemia silent carrier 01/13/2020   Medical history non-contributory     Past Surgical History: Past Surgical History:  Procedure Laterality Date   NO PAST SURGERIES      Obstetrical History: OB History     Gravida  4   Para  3   Term  2   Preterm  1   AB  0   Living  3      SAB  0   IAB  0   Ectopic  0   Multiple  0   Live Births  3           Social History Social History   Socioeconomic History   Marital status: Married    Spouse name: Not on file   Number of children: Not on file   Years of education: Not on file   Highest education level: Not on file  Occupational History   Not on file  Tobacco Use   Smoking status: Never   Smokeless tobacco: Never  Vaping Use   Vaping status: Never Used  Substance and Sexual Activity   Alcohol use: No   Drug use: Never   Sexual activity: Not Currently    Partners: Male  Other Topics Concern   Not on file  Social History Narrative   Not on file   Social Drivers of Health   Financial Resource Strain: Not on file  Food Insecurity: No Food Insecurity (12/12/2023)   Hunger Vital Sign    Worried About Running Out of Food in the Last Year: Never true    Ran Out of Food in the Last Year: Never true  Transportation Needs: Unmet Transportation Needs (12/12/2023)   PRAPARE - Administrator, Civil Service (Medical): Yes    Lack of Transportation  (Non-Medical): Yes  Physical Activity: Not on file  Stress: Not on file  Social Connections: Not on file    Family History: Family History  Problem Relation Age of Onset   Healthy Father    Healthy Mother     Allergies: No Known Allergies  Medications Prior to Admission  Medication Sig Dispense Refill Last Dose/Taking   ferrous sulfate  325 (65 FE) MG EC tablet Take 1 tablet (325 mg total) by mouth daily with breakfast. 90 tablet 3 03/20/2024   Prenatal Vit-Fe Fumarate-FA (GOODSENSE PRENATAL VITAMINS) 28-0.8 MG TABS Take 1 tablet by mouth daily. 30 tablet 11 03/20/2024     Review of Systems  All systems reviewed and negative except as stated in HPI.  Blood pressure 111/70, pulse 80, temperature 97.7 F (36.5 C), temperature source Oral, resp. rate 16, height 4\' 11"  (1.499 m), weight 73 kg, last menstrual period 05/24/2023, SpO2 100%, unknown if currently breastfeeding. General appearance: alert and cooperative Lungs: breathing comfortably on room air Heart: regular rate Abdomen: soft, non-tender; gravid Extremities: no edema of bilateral lower extremities Presentation: cephalic Fetal monitoring: 130/mod/+a/-d Uterine activity: every  2-5 min Dilation: 4 Effacement (%): (P) 80 Station: -1 Exam by: Vonita Guan, RN   Prenatal labs: ABO, Rh: --/--/AB POS (05/01 0830) Antibody: NEG (05/01 0830) Rubella: 4.78 (09/20 0937) RPR: Non Reactive (01/31 0812)  HBsAg: Negative (09/20 4098)  HIV: Non Reactive (01/31 1191)  GBS: Negative/-- (04/01 1208)  2 hr Glucola wnl Genetic screening low risk Anatomy US  wnl Last US : At [redacted]w[redacted]d - cephalic presentation, EFW 2032g (49 %tile), AC 73%tile  Prenatal Transfer Tool  Maternal Diabetes: No Genetic Screening: Normal Maternal Ultrasounds/Referrals: Normal Fetal Ultrasounds or other Referrals:  None Maternal Substance Abuse:  No Significant Maternal Medications:  None Significant Maternal Lab Results:  Group B Strep negative Number of  Prenatal Visits:greater than 3 verified prenatal visits Other Comments:  None  Results for orders placed or performed during the hospital encounter of 03/21/24 (from the past 24 hours)  POCT fern test   Collection Time: 03/21/24  8:21 AM  Result Value Ref Range   POCT Fern Test Positive = ruptured amniotic membanes   Type and screen [facility]   Collection Time: 03/21/24  8:30 AM  Result Value Ref Range   ABO/RH(D) AB POS    Antibody Screen NEG    Sample Expiration      03/24/2024,2359 Performed at [facility], [address] [place], [place] [address]   CBC   Collection Time: 03/21/24  8:31 AM  Result Value Ref Range   WBC 9.3 4.0 - 10.5 K/uL   RBC 4.98 3.87 - 5.11 MIL/uL   Hemoglobin 12.2 12.0 - 15.0 g/dL   HCT 56.2 13.0 - 86.5 %   MCV 78.1 (L) 80.0 - 100.0 fL   MCH 24.5 (L) 26.0 - 34.0 pg   MCHC 31.4 30.0 - 36.0 g/dL   RDW 78.4 (H) 69.6 - 29.5 %   Platelets 282 150 - 400 K/uL   nRBC 0.0 0.0 - 0.2 %    Patient Active Problem List   Diagnosis Date Noted   Normal labor 03/21/2024   Supervision of high risk pregnancy, antepartum 02/20/2024   Anemia in pregnancy 12/25/2023   Unwanted fertility 12/22/2023   History of prior pregnancy with IUGR newborn 10/12/2023   Vaginal bleeding in pregnancy, second trimester 09/24/2023   Alpha thalassemia silent carrier 01/13/2020   Language barrier 02/21/2019   History of preterm delivery     Assessment/Plan:  [name] is a 28 y.o. [id number] at [redacted]w[redacted]d here for labor  #Labor: Expectant management, anticipate SVD soon. #Pain: Epidural #FWB: Cat I #ID:  GBS neg #MOF: Both #MOC: Desires PP BTL #Circ:  No  #Anemia of pregnancy: HgB 12.2 on admit  Melanie Spires, MD OB Fellow, Faculty Practice [facility], Center for [facility] 03/21/2024 2:13 PM

## 2024-03-21 NOTE — Progress Notes (Signed)
 Called to room due to greater than expected bleeding.  Patient is s/p delivery of 8# infant No lacerations noted at delivery Uterine tone was intermittent.   Baseline HGB 11.3 Repeat was drawn prior to my arrival.   Due to uterine atony placed Jada per manufacturer instructions  easily with return of blood into the tube. Placed to wall suction.    Abner Ables, MD

## 2024-03-21 NOTE — MAU Note (Signed)
 [name] is a 28 y.o. at [redacted]w[redacted]d here in MAU reporting: she's been having ctxs since yesterday, but they worsened this morning @ 0800.  Also states her water broke at 0800, reports fluid is white.    LMP: 06/20/2023 Onset of complaint: 0800 Pain score: 6 Vitals:   03/21/24 0816 03/21/24 0817  BP: 104/64 104/64  Pulse: 85 85  Resp: 18   Temp: 97.7 F (36.5 C)   SpO2: 100%      FHT: 135 bpm  Lab orders placed from triage: None

## 2024-03-22 ENCOUNTER — Encounter (HOSPITAL_COMMUNITY): Payer: Self-pay | Admitting: Family Medicine

## 2024-03-22 ENCOUNTER — Inpatient Hospital Stay (HOSPITAL_COMMUNITY): Admitting: Anesthesiology

## 2024-03-22 ENCOUNTER — Other Ambulatory Visit: Payer: Self-pay

## 2024-03-22 ENCOUNTER — Encounter: Admitting: Obstetrics and Gynecology

## 2024-03-22 ENCOUNTER — Encounter (HOSPITAL_COMMUNITY)
Admission: AD | Disposition: A | Discharge status: Home / Self Care | Payer: Self-pay | Source: Home / Self Care | Attending: Family Medicine

## 2024-03-22 DIAGNOSIS — Z302 Encounter for sterilization: Secondary | ICD-10-CM | POA: Diagnosis not present

## 2024-03-22 DIAGNOSIS — Z308 Encounter for other contraceptive management: Secondary | ICD-10-CM | POA: Diagnosis not present

## 2024-03-22 DIAGNOSIS — D62 Acute posthemorrhagic anemia: Secondary | ICD-10-CM | POA: Insufficient documentation

## 2024-03-22 HISTORY — PX: TUBAL LIGATION: SHX77

## 2024-03-22 LAB — CBC
HCT: 30.2 % — ABNORMAL LOW (ref 36.0–46.0)
Hemoglobin: 9.5 g/dL — ABNORMAL LOW (ref 12.0–15.0)
MCH: 24.5 pg — ABNORMAL LOW (ref 26.0–34.0)
MCHC: 31.5 g/dL (ref 30.0–36.0)
MCV: 78 fL — ABNORMAL LOW (ref 80.0–100.0)
Platelets: 248 10*3/uL (ref 150–400)
RBC: 3.87 MIL/uL (ref 3.87–5.11)
RDW: 21.8 % — ABNORMAL HIGH (ref 11.5–15.5)
WBC: 13.2 10*3/uL — ABNORMAL HIGH (ref 4.0–10.5)
nRBC: 0 % (ref 0.0–0.2)

## 2024-03-22 SURGERY — LIGATION, FALLOPIAN TUBE, POSTPARTUM
Anesthesia: Epidural

## 2024-03-22 MED ORDER — IBUPROFEN 600 MG PO TABS
600.0000 mg | ORAL_TABLET | Freq: Four times a day (QID) | ORAL | Status: DC
  Administered 2024-03-22 – 2024-03-23 (×3): 600 mg via ORAL
  Filled 2024-03-22 (×3): qty 1

## 2024-03-22 MED ORDER — KETOROLAC TROMETHAMINE 30 MG/ML IJ SOLN: 30.0000 mg | Freq: Once | INTRAMUSCULAR | Status: DC

## 2024-03-22 MED ORDER — PRENATAL MULTIVITAMIN CH
1.0000 | ORAL_TABLET | Freq: Every day | ORAL | Status: DC
  Administered 2024-03-23: 1 via ORAL
  Filled 2024-03-22: qty 1

## 2024-03-22 MED ORDER — DEXAMETHASONE SODIUM PHOSPHATE 10 MG/ML IJ SOLN
INTRAMUSCULAR | Status: DC | PRN
Start: 2024-03-22 — End: 2024-03-22
  Administered 2024-03-22: 8 mg via INTRAVENOUS

## 2024-03-22 MED ORDER — FERROUS SULFATE 325 (65 FE) MG PO TABS: 325.0000 mg | ORAL_TABLET | ORAL | Status: DC

## 2024-03-22 MED ORDER — OXYCODONE HCL 5 MG PO TABS: 5.0000 mg | ORAL_TABLET | ORAL | Status: DC | PRN

## 2024-03-22 MED ORDER — ONDANSETRON HCL 4 MG/2ML IJ SOLN: 4.0000 mg | INTRAMUSCULAR | Status: DC | PRN

## 2024-03-22 MED ORDER — FENTANYL CITRATE (PF) 100 MCG/2ML IJ SOLN
INTRAMUSCULAR | Status: DC | PRN
  Administered 2024-03-22: 100 ug via EPIDURAL

## 2024-03-22 MED ORDER — BUPIVACAINE HCL (PF) 0.25 % IJ SOLN
INTRAMUSCULAR | Status: AC
  Filled 2024-03-22: qty 30

## 2024-03-22 MED ORDER — DIPHENHYDRAMINE HCL 25 MG PO CAPS: 25.0000 mg | ORAL_CAPSULE | Freq: Four times a day (QID) | ORAL | Status: DC | PRN

## 2024-03-22 MED ORDER — BUPIVACAINE HCL (PF) 0.25 % IJ SOLN
INTRAMUSCULAR | Status: DC | PRN
  Administered 2024-03-22: 10 mL

## 2024-03-22 MED ORDER — LACTATED RINGERS IV SOLN
INTRAVENOUS | Status: DC
  Administered 2024-03-22: 10 mL via INTRAVENOUS

## 2024-03-22 MED ORDER — KETOROLAC TROMETHAMINE 30 MG/ML IJ SOLN
INTRAMUSCULAR | Status: AC
Start: 2024-03-22 — End: ?
  Filled 2024-03-22: qty 1

## 2024-03-22 MED ORDER — BENZOCAINE-MENTHOL 20-0.5 % EX AERO: 1.0000 | INHALATION_SPRAY | CUTANEOUS | Status: DC | PRN

## 2024-03-22 MED ORDER — SIMETHICONE 80 MG PO CHEW: 80.0000 mg | CHEWABLE_TABLET | ORAL | Status: DC | PRN

## 2024-03-22 MED ORDER — SENNOSIDES-DOCUSATE SODIUM 8.6-50 MG PO TABS
2.0000 | ORAL_TABLET | ORAL | Status: DC
  Administered 2024-03-23: 2 via ORAL
  Filled 2024-03-22: qty 2

## 2024-03-22 MED ORDER — DROPERIDOL 2.5 MG/ML IJ SOLN: 0.6250 mg | Freq: Once | INTRAMUSCULAR | Status: DC | PRN

## 2024-03-22 MED ORDER — FENTANYL CITRATE (PF) 250 MCG/5ML IJ SOLN
INTRAMUSCULAR | Status: DC | PRN
  Administered 2024-03-22: 50 ug via INTRAVENOUS
  Administered 2024-03-22: 25 ug via INTRAVENOUS

## 2024-03-22 MED ORDER — ONDANSETRON HCL 4 MG/2ML IJ SOLN
INTRAMUSCULAR | Status: DC | PRN
  Administered 2024-03-22: 4 mg via INTRAVENOUS

## 2024-03-22 MED ORDER — METOCLOPRAMIDE HCL 10 MG PO TABS
10.0000 mg | ORAL_TABLET | Freq: Once | ORAL | Status: AC
  Administered 2024-03-22: 10 mg via ORAL
  Filled 2024-03-22: qty 1

## 2024-03-22 MED ORDER — OXYTOCIN-SODIUM CHLORIDE 30-0.9 UT/500ML-% IV SOLN: 2.5000 [IU]/h | INTRAVENOUS | Status: DC | PRN

## 2024-03-22 MED ORDER — LIDOCAINE-EPINEPHRINE (PF) 2 %-1:200000 IJ SOLN
INTRAMUSCULAR | Status: DC | PRN
  Administered 2024-03-22: 5 mL via EPIDURAL
  Administered 2024-03-22: 4 mL via EPIDURAL
  Administered 2024-03-22: 5 mL via EPIDURAL

## 2024-03-22 MED ORDER — ZOLPIDEM TARTRATE 5 MG PO TABS: 5.0000 mg | ORAL_TABLET | Freq: Every evening | ORAL | Status: DC | PRN

## 2024-03-22 MED ORDER — WITCH HAZEL-GLYCERIN EX PADS: 1.0000 | MEDICATED_PAD | CUTANEOUS | Status: DC | PRN

## 2024-03-22 MED ORDER — DIBUCAINE (PERIANAL) 1 % EX OINT: 1.0000 | TOPICAL_OINTMENT | CUTANEOUS | Status: DC | PRN

## 2024-03-22 MED ORDER — KETOROLAC TROMETHAMINE 30 MG/ML IJ SOLN
INTRAMUSCULAR | Status: DC | PRN
  Administered 2024-03-22: 30 mg via INTRAVENOUS

## 2024-03-22 MED ORDER — MEASLES, MUMPS & RUBELLA VAC IJ SOLR: 0.5000 mL | Freq: Once | INTRAMUSCULAR | Status: DC

## 2024-03-22 MED ORDER — FENTANYL CITRATE (PF) 100 MCG/2ML IJ SOLN: 25.0000 ug | INTRAMUSCULAR | Status: DC | PRN

## 2024-03-22 MED ORDER — COCONUT OIL OIL: 1.0000 | TOPICAL_OIL | Status: DC | PRN

## 2024-03-22 MED ORDER — ONDANSETRON HCL 4 MG PO TABS: 4.0000 mg | ORAL_TABLET | ORAL | Status: DC | PRN

## 2024-03-22 MED ORDER — ACETAMINOPHEN 325 MG PO TABS
650.0000 mg | ORAL_TABLET | ORAL | Status: DC | PRN
  Administered 2024-03-22 (×2): 650 mg via ORAL
  Filled 2024-03-22 (×2): qty 2

## 2024-03-22 MED ORDER — FENTANYL CITRATE (PF) 250 MCG/5ML IJ SOLN
INTRAMUSCULAR | Status: AC
  Filled 2024-03-22: qty 5

## 2024-03-22 MED ORDER — FAMOTIDINE 20 MG PO TABS
40.0000 mg | ORAL_TABLET | Freq: Once | ORAL | Status: AC
  Administered 2024-03-22: 40 mg via ORAL
  Filled 2024-03-22: qty 2

## 2024-03-22 SURGICAL SUPPLY — 20 items
BLADE SURG 11 STRL SS (BLADE) ×1 IMPLANT
CHLORAPREP W/TINT 26 (MISCELLANEOUS) ×2 IMPLANT
DRSG OPSITE POSTOP 3X4 (GAUZE/BANDAGES/DRESSINGS) ×1 IMPLANT
DURAPREP 26ML APPLICATOR (WOUND CARE) IMPLANT
GLOVE BIOGEL PI IND STRL 7.0 (GLOVE) ×1 IMPLANT
GLOVE BIOGEL PI IND STRL 7.5 (GLOVE) ×2 IMPLANT
GLOVE ECLIPSE 7.5 STRL STRAW (GLOVE) ×1 IMPLANT
GOWN STRL REUS W/TWL LRG LVL3 (GOWN DISPOSABLE) ×2 IMPLANT
NEEDLE HYPO 22GX1.5 SAFETY (NEEDLE) ×1 IMPLANT
NS IRRIG 1000ML POUR BTL (IV SOLUTION) ×1 IMPLANT
PACK ABDOMINAL MINOR (CUSTOM PROCEDURE TRAY) ×1 IMPLANT
PROTECTOR NERVE ULNAR (MISCELLANEOUS) ×1 IMPLANT
SPONGE LAP 18X18 RF (DISPOSABLE) ×1 IMPLANT
SPONGE LAP 4X18 RFD (DISPOSABLE) IMPLANT
SUT PLAIN 0 NONE (SUTURE) IMPLANT
SUT VICRYL 0 UR6 27IN ABS (SUTURE) ×1 IMPLANT
SUT VICRYL 4-0 PS2 18IN ABS (SUTURE) ×1 IMPLANT
SYR CONTROL 10ML LL (SYRINGE) ×1 IMPLANT
TOWEL OR 17X24 6PK STRL BLUE (TOWEL DISPOSABLE) ×2 IMPLANT
TRAY FOLEY W/BAG SLVR 14FR (SET/KITS/TRAYS/PACK) ×1 IMPLANT

## 2024-03-22 NOTE — Anesthesia Postprocedure Evaluation (Signed)
 Anesthesia Post Note  Patient: [name]  Procedure(s) Performed: LIGATION, FALLOPIAN TUBE, POSTPARTUM     Patient location during evaluation: Mother Baby Anesthesia Type: Epidural Level of consciousness: awake and alert Pain management: pain level controlled Vital Signs Assessment: post-procedure vital signs reviewed and stable Respiratory status: spontaneous breathing, nonlabored ventilation and respiratory function stable Cardiovascular status: stable Postop Assessment: no headache, no backache, epidural receding, no apparent nausea or vomiting, patient able to bend at knees, able to ambulate and adequate PO intake Anesthetic complications: no   No notable events documented.  Last Vitals:  Vitals:   03/22/24 0403 03/22/24 0812  BP: (!) 90/58 90/63  Pulse: 81 87  Resp: 18 16  Temp: 37.1 C 36.7 C  SpO2: 100% 98%    Last Pain:  Vitals:   03/22/24 0812  TempSrc: Oral  PainSc:    Pain Goal: Patients Stated Pain Goal: 8 (03/21/24 3875)              Epidural/Spinal Function Cutaneous sensation: Normal sensation (03/22/24 0810)  Dellis Fermo

## 2024-03-22 NOTE — Transfer of Care (Signed)
 Immediate Anesthesia Transfer of Care Note  Patient: [name]  Procedure(s) Performed: LIGATION, FALLOPIAN TUBE, POSTPARTUM  Patient Location: PACU  Anesthesia Type:Epidural  Level of Consciousness: awake, alert , and oriented  Airway & Oxygen Therapy: Patient Spontanous Breathing  Post-op Assessment: Report given to RN and Post -op Vital signs reviewed and stable  Post vital signs: Reviewed and stable  Last Vitals:  Vitals Value Taken Time  BP 97/63   Temp    Pulse 82   Resp 20   SpO2 97%     Last Pain:  Vitals:   03/22/24 1226  TempSrc: Oral  PainSc:       Patients Stated Pain Goal: 8 (03/21/24 0852)  Complications: No notable events documented.

## 2024-03-22 NOTE — Progress Notes (Signed)
 POSTPARTUM PROGRESS NOTE  Post Partum Day 1 Subjective:  [name] is a 28 y.o. [id number] [redacted]w[redacted]d s/p nsvd.  No acute events overnight.  Pt denies problems with ambulating, voiding or po intake.  She denies nausea or vomiting.  Pain is well controlled.  She has had flatus. She has not had bowel movement.  Lochia Small.   Objective: Blood pressure 90/63, pulse 87, temperature 98.1 F (36.7 C), temperature source Oral, resp. rate 16, height 4\' 11"  (1.499 m), weight 73 kg, last menstrual period 05/24/2023, SpO2 98%, unknown if currently breastfeeding.  Physical Exam:  General: alert, cooperative and no distress Lochia:normal flow Chest: CTAB Heart: RRR no m/r/g Abdomen: +BS, soft, nontender,  Uterine Fundus: firm,  DVT Evaluation: No calf swelling or tenderness Extremities: no edema  Recent Labs    03/21/24 1630 03/22/24 0825  HGB 11.3* 9.5*  HCT 37.9 30.2*    Assessment/Plan:  ASSESSMENT: [name] is a 28 y.o. [id number] [redacted]w[redacted]d s/p nsvd c/b PPH s/p txa, methergine , pit, and jada device. Feeling well, hgb 9.5 today from 12.2 on admission, asymptomatic, will start oral iron . Bleeding well controlled now. Breastfeeding. Declines \\circ . plan for tubal today. [name] interpreter utilized.  Patient desires permanent sterilization.  Other reversible forms of contraception were discussed with patient; she declines all other modalities. Risks of procedure discussed with patient including but not limited to: risk of regret, permanence of method, bleeding, infection, injury to surrounding organs and need for additional procedures.  Failure risk of 1-2 % with increased risk of ectopic gestation if pregnancy occurs was also discussed with patient.  Patient verbalized understanding of these risks and wants to proceed with sterilization.  Written informed consent obtained.  To OR when ready. Patient elects salpingectomy if feasible and is aware this is irreversible.   Plan for discharge tomorrow (patient  request)   LOS: 1 day   [name] 03/22/2024, 10:54 AM

## 2024-03-22 NOTE — Progress Notes (Signed)
 Roann Chestnut Interpreter [phone] utilized at bedside.

## 2024-03-22 NOTE — Anesthesia Preprocedure Evaluation (Signed)
 Anesthesia Evaluation  Patient identified by MRN, date of birth, ID band Patient awake    Reviewed: Allergy & Precautions, NPO status , Patient's Chart, lab work & pertinent test results  History of Anesthesia Complications Negative for: history of anesthetic complications  Airway Mallampati: II  TM Distance: >3 FB Neck ROM: Full    Dental no notable dental hx.    Pulmonary neg pulmonary ROS   Pulmonary exam normal        Cardiovascular negative cardio ROS Normal cardiovascular exam     Neuro/Psych negative neurological ROS     GI/Hepatic negative GI ROS, Neg liver ROS,,,  Endo/Other  negative endocrine ROS    Renal/GU negative Renal ROS  negative genitourinary   Musculoskeletal negative musculoskeletal ROS (+)    Abdominal   Peds  Hematology negative hematology ROS (+)   Anesthesia Other Findings Day of surgery medications reviewed with patient.  Reproductive/Obstetrics PPD#1, undesired fertility                              Anesthesia Physical Anesthesia Plan  ASA: 2  Anesthesia Plan: Epidural   Post-op Pain Management: Minimal or no pain anticipated   Induction:   PONV Risk Score and Plan: Treatment may vary due to age or medical condition  Airway Management Planned: Natural Airway  Additional Equipment:   Intra-op Plan:   Post-operative Plan:   Informed Consent: I have reviewed the patients History and Physical, chart, labs and discussed the procedure including the risks, benefits and alternatives for the proposed anesthesia with the patient or authorized representative who has indicated his/her understanding and acceptance.     Interpreter used for interview  Plan Discussed with: CRNA  Anesthesia Plan Comments:          Anesthesia Quick Evaluation

## 2024-03-22 NOTE — Op Note (Signed)
 [name] 03/21/2024 - 03/22/2024  PREOPERATIVE DIAGNOSES: Undesired fertility  POSTOPERATIVE DIAGNOSES: Undesired fertility  PROCEDURE:  Postpartum tubal sterilization (modified pomeroy on the left, salpingectomy on the right.  SURGEON: Dr.  Hines Ludwig, MD  ASSISTANT:  Dr. Scherrie Curt.  An experienced assistant was required given the standard of surgical care given the complexity of the case.  This assistant was needed for exposure, dissection, suctioning, retraction, instrument exchange, and for overall help during the procedure.  ANESTHESIA:  Epidural and local analgesia using 10 ml of 0.25% Marcaine   COMPLICATIONS:  None immediate.  ESTIMATED BLOOD LOSS: <10 ml.  INDICATIONS:  28 y.o. [id number] with undesired fertility, status post vaginal delivery, desires permanent sterilization.  Other reversible forms of contraception were discussed with patient; she declines all other modalities. Risks of procedure discussed with patient including but not limited to: risk of regret, permanence of method, bleeding, infection, injury to surrounding organs and need for additional procedures.  Discussed failure risk of 1% with increased risk of ectopic gestation if pregnancy occurs.  Also discussed possibility of post-tubal syndrome with increased pelvic pain or menstrual irregularities.  Patient verbalized understanding of these risks and wants to proceed with sterilization.  Written informed consent obtained.     FINDINGS:  Normal uterus, tubes. Excised fallopian tubes were sent to pathology.   PROCEDURE DETAILS: The patient was taken to the operating room where her epidural anesthesia was dosed up to surgical level and found to be adequate.  She was then placed in the dorsal supine position and prepped and draped in sterile fashion.   After an adequate timeout was performed, attention was turned to the patient's abdomen   A small transverse skin incision was made under the umbilical fold. The incision was taken down  to the layer of fascia using the scalpel, and fascia was incised, and extended bilaterally using Mayo scissors. The peritoneum was entered bluntly.   Attention was then turned to the left fallopian tube. With a Burna Carrier it was followed to the fimbriated end, but we were unable to mobilize enough of the tube to proceed with salpingectomy. Instead we elected to proceed with a modified Pomeroy. The tube was grasped with the Babcock and a portion of the tube was excised after placement of two 2-0 plain gut sutures. Hemostasis was observed after excision of a portion of the tube. Attention was then turned to the right fallopian tube. Kelly forceps were placed on the mesosalpinx underneath most of the tube.  This pedicle was double suture ligated with 2-0 plain gut, and the tube including the fimbriated end was excised.    Good hemostasis was noted overall.  The instruments were then removed from the patient's abdomen and the fascial incision was repaired with 0 Vicryl, and the skin was closed with a 4-0 Vicryl subcuticular stitch. 10 mL of 0.25% marcaine  were infused subcutaneously. The patient tolerated the procedure well.  Instrument, sponge, and needle counts were correct times three.  The patient was then taken to the recovery room awake and in stable condition.   Hines Ludwig, MD Center for [employer], [facility]

## 2024-03-22 NOTE — Anesthesia Postprocedure Evaluation (Signed)
 Anesthesia Post Note  Patient: [name]  Procedure(s) Performed: LIGATION, FALLOPIAN TUBE, POSTPARTUM     Patient location during evaluation: PACU Anesthesia Type: Epidural Level of consciousness: awake and alert Pain management: pain level controlled Vital Signs Assessment: post-procedure vital signs reviewed and stable Respiratory status: spontaneous breathing, nonlabored ventilation and respiratory function stable Cardiovascular status: blood pressure returned to baseline Postop Assessment: epidural receding, no apparent nausea or vomiting, no headache and no backache Anesthetic complications: no   No notable events documented.  Last Vitals:  Vitals:   03/22/24 1635 03/22/24 1647  BP:  104/78  Pulse: 66 63  Resp: 19 18  Temp: 36.9 C 37.1 C  SpO2: 97% 94%    Last Pain:  Vitals:   03/22/24 1647  TempSrc: Oral  PainSc: 0-No pain                 Rayfield Cairo

## 2024-03-23 MED ORDER — IBUPROFEN 600 MG PO TABS: 600.0000 mg | ORAL_TABLET | Freq: Four times a day (QID) | ORAL | 0 refills | Status: AC

## 2024-03-23 MED ORDER — OXYCODONE HCL 5 MG PO TABS: 5.0000 mg | ORAL_TABLET | Freq: Four times a day (QID) | ORAL | 0 refills | Status: DC | PRN

## 2024-03-25 ENCOUNTER — Encounter (HOSPITAL_COMMUNITY): Payer: Self-pay | Admitting: Obstetrics and Gynecology

## 2024-03-26 LAB — SURGICAL PATHOLOGY

## 2024-03-27 ENCOUNTER — Inpatient Hospital Stay (HOSPITAL_COMMUNITY): Admission: RE | Admit: 2024-03-27 | Source: Home / Self Care | Admitting: Obstetrics & Gynecology

## 2024-03-27 ENCOUNTER — Inpatient Hospital Stay (HOSPITAL_COMMUNITY)

## 2024-04-01 DIAGNOSIS — Z419 Encounter for procedure for purposes other than remedying health state, unspecified: Secondary | ICD-10-CM | POA: Diagnosis not present

## 2024-04-04 ENCOUNTER — Telehealth (HOSPITAL_COMMUNITY): Payer: Self-pay | Admitting: *Deleted

## 2024-04-04 NOTE — Telephone Encounter (Signed)
 Attempted hospital discharge follow-up call with Language Line interpreter. Message left for patient to return RN call with any questions or concerns. Julien Odor, RN, 04/04/24, [phone]

## 2024-04-15 ENCOUNTER — Other Ambulatory Visit: Payer: Self-pay | Admitting: Obstetrics & Gynecology

## 2024-04-17 ENCOUNTER — Other Ambulatory Visit: Payer: Self-pay

## 2024-05-02 ENCOUNTER — Ambulatory Visit: Admitting: Physician Assistant

## 2024-05-02 ENCOUNTER — Encounter: Payer: Self-pay | Admitting: Physician Assistant

## 2024-05-02 VITALS — BP 91/60 | HR 76 | Wt 150.2 lb

## 2024-05-02 DIAGNOSIS — K5900 Constipation, unspecified: Secondary | ICD-10-CM | POA: Diagnosis not present

## 2024-05-02 DIAGNOSIS — Z419 Encounter for procedure for purposes other than remedying health state, unspecified: Secondary | ICD-10-CM | POA: Diagnosis not present

## 2024-05-02 MED ORDER — PREPLUS 27-1 MG PO TABS: 1.0000 | ORAL_TABLET | Freq: Every day | ORAL | 13 refills | Status: AC

## 2024-05-02 MED ORDER — POLYETHYLENE GLYCOL 3350 17 G PO PACK: 17.0000 g | PACK | Freq: Every day | ORAL | 0 refills | Status: AC

## 2024-05-02 NOTE — Progress Notes (Signed)
 Post Partum Visit Note  [name] is a 28 y.o. [phone] female who presents for a postpartum visit. She is 6 weeks postpartum following a normal spontaneous vaginal delivery.  I have fully reviewed the prenatal and intrapartum course. The delivery was at 40 gestational weeks.  Anesthesia: epidural. Postpartum course has been good. Baby is doing well yes. Baby is feeding by both breast and bottle - Similac Advance. Bleeding no bleeding. Bowel function is little bit constipation. Bladder function is normal. Patient is not sexually active. Contraception method is abstinence.pt states she had tubal ligation.    Postpartum depression screening: negative.  The pregnancy intention screening data noted above was reviewed. Potential methods of contraception were discussed. The patient elected to proceed with No data recorded.   Edinburgh Postnatal Depression Scale - 05/02/24 1359       Edinburgh Postnatal Depression Scale:  In the Past 7 Days   I have been able to laugh and see the funny side of things. 0    I have looked forward with enjoyment to things. 0    I have blamed myself unnecessarily when things went wrong. 0    I have been anxious or worried for no good reason. 0    I have felt scared or panicky for no good reason. 0    Things have been getting on top of me. 1    I have been so unhappy that I have had difficulty sleeping. 0    I have felt sad or miserable. 0    I have been so unhappy that I have been crying. 0    The thought of harming myself has occurred to me. 0    Edinburgh Postnatal Depression Scale Total 1          Health Maintenance Due  Topic Date Due   HPV VACCINES (1 - 3-dose series) Never done   COVID-19 Vaccine (3 - 2024-25 season) 07/23/2023    The following portions of the patient's history were reviewed and updated as appropriate: allergies, current medications, past family history, past medical history, past social history, and problem list.  Review of  Systems Pertinent items noted in HPI and remainder of comprehensive ROS otherwise negative.  Objective:  BP 91/60   Pulse 76   Wt 150 lb 3.2 oz (68.1 kg)   LMP 05/24/2023   Breastfeeding Yes   BMI 30.34 kg/m    General:  alert, cooperative, and appears stated age   Breasts:  normal  Lungs: clear to auscultation bilaterally  Heart:  regular rate and rhythm, S1, S2 normal, no murmur, click, rub or gallop  Abdomen: soft, non-tender; bowel sounds normal; no masses,  no organomegaly   Wound well approximated incision at umbilicus  GU exam:  not indicated       Assessment:   1. Postpartum care and examination (Primary) - Patient doing well - Lab visit for repeat CBC in 6-8 weeks - Prenatal Vit-Fe Fumarate-FA (PREPLUS) 27-1 MG TABS; Take 1 tablet by mouth daily.  Dispense: 30 tablet; Refill: 13   2. Constipation, unspecified constipation type  - polyethylene glycol (MIRALAX) 17 g packet; Take 17 g by mouth daily.  Dispense: 14 each; Refill: 0  Plan:   Essential components of care per ACOG recommendations:  1.  Mood and well being: Patient with negative depression screening today. Reviewed local resources for support.  - Patient tobacco use? No.   - hx of drug use? No.    2. Infant  care and feeding:  -Patient currently breastmilk feeding? Yes. Reviewed importance of draining breast regularly to support lactation.  -Social determinants of health (SDOH) reviewed in EPIC.   3. Sexuality, contraception and birth spacing - Patient does not want a pregnancy in the next year.  Desired family size achieved. Patient got tubal ligation.   4. Sleep and fatigue -Encouraged family/partner/community support of 4 hrs of uninterrupted sleep to help with mood and fatigue  5. Physical Recovery  - Discussed patients delivery and complications. She describes her labor as good. - Patient had a vaginal delivery with 40 second shoulder dystocia. Patient had no laceration. Perineal healing  reviewed. Patient expressed understanding - Patient has urinary incontinence? No. - Patient is safe to resume physical and sexual activity  6.  Health Maintenance - HM due items addressed Yes - Last pap smear  Diagnosis  Date Value Ref Range Status  09/06/2023   Final   - Negative for intraepithelial lesion or malignancy (NILM)   Pap smear not indicated at today's visit.  -Breast Cancer screening indicated? No.   7. Chronic Disease/Pregnancy Condition follow up: Anemia  - PCP follow up  Luevenia Saha, PA-C Center for [employer], [facility]

## 2024-05-02 NOTE — Progress Notes (Signed)
 Postpartum visit. Had tubal ligation 03/22/24.

## 2024-06-01 DIAGNOSIS — Z419 Encounter for procedure for purposes other than remedying health state, unspecified: Secondary | ICD-10-CM | POA: Diagnosis not present

## 2024-07-02 DIAGNOSIS — Z419 Encounter for procedure for purposes other than remedying health state, unspecified: Secondary | ICD-10-CM | POA: Diagnosis not present

## 2024-08-02 ENCOUNTER — Other Ambulatory Visit

## 2024-08-02 DIAGNOSIS — D62 Acute posthemorrhagic anemia: Secondary | ICD-10-CM

## 2024-08-02 DIAGNOSIS — Z419 Encounter for procedure for purposes other than remedying health state, unspecified: Secondary | ICD-10-CM | POA: Diagnosis not present

## 2024-08-03 LAB — CBC
Hematocrit: 40.7 % (ref 34.0–46.6)
Hemoglobin: 12.4 g/dL (ref 11.1–15.9)
MCH: 23.8 pg — ABNORMAL LOW (ref 26.6–33.0)
MCHC: 30.5 g/dL — ABNORMAL LOW (ref 31.5–35.7)
MCV: 78 fL — ABNORMAL LOW (ref 79–97)
Platelets: 324 x10E3/uL (ref 150–450)
RBC: 5.22 x10E6/uL (ref 3.77–5.28)
RDW: 14.5 % (ref 11.7–15.4)
WBC: 8.4 x10E3/uL (ref 3.4–10.8)

## 2024-08-04 ENCOUNTER — Ambulatory Visit: Payer: Self-pay | Admitting: Physician Assistant

## 2024-08-07 ENCOUNTER — Other Ambulatory Visit: Payer: Self-pay | Admitting: Obstetrics and Gynecology

## 2024-08-23 ENCOUNTER — Ambulatory Visit (HOSPITAL_COMMUNITY)
Admission: EM | Admit: 2024-08-23 | Discharge: 2024-08-23 | Disposition: A | Discharge status: Home / Self Care | Attending: Physician Assistant | Admitting: Physician Assistant

## 2024-08-23 ENCOUNTER — Encounter (HOSPITAL_COMMUNITY): Payer: Self-pay

## 2024-08-23 DIAGNOSIS — N939 Abnormal uterine and vaginal bleeding, unspecified: Secondary | ICD-10-CM

## 2024-08-23 DIAGNOSIS — R5383 Other fatigue: Secondary | ICD-10-CM | POA: Insufficient documentation

## 2024-08-23 LAB — CBC
HCT: 41.1 % (ref 36.0–46.0)
Hemoglobin: 12.4 g/dL (ref 12.0–15.0)
MCH: 23.4 pg — ABNORMAL LOW (ref 26.0–34.0)
MCHC: 30.2 g/dL (ref 30.0–36.0)
MCV: 77.4 fL — ABNORMAL LOW (ref 80.0–100.0)
Platelets: 360 K/uL (ref 150–400)
RBC: 5.31 MIL/uL — ABNORMAL HIGH (ref 3.87–5.11)
RDW: 15.1 % (ref 11.5–15.5)
WBC: 8.1 K/uL (ref 4.0–10.5)
nRBC: 0 % (ref 0.0–0.2)

## 2024-08-23 LAB — COMPREHENSIVE METABOLIC PANEL WITH GFR
ALT: 20 U/L (ref 0–44)
AST: 17 U/L (ref 15–41)
Albumin: 4.3 g/dL (ref 3.5–5.0)
Alkaline Phosphatase: 56 U/L (ref 38–126)
Anion gap: 10 (ref 5–15)
BUN: 9 mg/dL (ref 6–20)
CO2: 24 mmol/L (ref 22–32)
Calcium: 9.4 mg/dL (ref 8.9–10.3)
Chloride: 106 mmol/L (ref 98–111)
Creatinine, Ser: 0.9 mg/dL (ref 0.44–1.00)
GFR, Estimated: >60 mL/min (ref >60)
Glucose, Bld: 95 mg/dL (ref 70–99)
Potassium: 3.8 mmol/L (ref 3.5–5.1)
Sodium: 140 mmol/L (ref 135–145)
Total Bilirubin: 0.5 mg/dL (ref 0.0–1.2)
Total Protein: 7.7 g/dL (ref 6.5–8.1)

## 2024-08-23 LAB — TSH: TSH: 4.908 u[IU]/mL — ABNORMAL HIGH (ref 0.350–4.500)

## 2024-08-23 LAB — POCT URINE PREGNANCY: Preg Test, Ur: NEGATIVE

## 2024-08-23 LAB — T4, FREE: Free T4: 0.87 ng/dL (ref 0.61–1.12)

## 2024-08-23 MED ORDER — NORETHINDRONE ACETATE 5 MG PO TABS: 5.0000 mg | ORAL_TABLET | Freq: Every day | ORAL | 0 refills | Status: AC

## 2024-08-23 NOTE — Discharge Instructions (Signed)
 Your urine pregnancy test was negative.  I will contact you if any of your blood test is abnormal.  Start norethindrone daily to help with the bleeding.  Follow-up with your OB/GYN as soon as possible.  If you have any shortness of breath, heart racing, weakness, persistent heavy bleeding you need to go to the emergency room.

## 2024-08-23 NOTE — ED Triage Notes (Addendum)
 Pt present vaginal bleeding, pt states it been for 12 days, pt state this the first period since she gave birth back in Mar 21, 2024. Pt state she got tubes tied right after birth

## 2024-08-23 NOTE — ED Provider Notes (Signed)
 [facility]    CSN: [id number] Arrival date & time: 08/23/24  1856      History   Chief Complaint Chief Complaint  Patient presents with   Vaginal Bleeding    HPI [name] is a 28 y.o. female.   Patient presents today accompanied by her cousin who provided translation as they declined a video interpreter.  Reports that she has had ongoing vaginal bleeding for the past 12 days with intermittent clotting.  She did recently give birth on 03/21/2024 and this is the first menstrual cycle she has had since birth.  She had a tubal ligation at the time that she gave birth and so has not been using any hormonal contraception.  She denies any recent medication changes.  Denies history of bleeding disorder or irregular periods and prior to her pregnancy she was having normal periods lasting about a week and occurring every 28 to 30 days.  She denies any history of thyroid condition but does report that she is feeling significantly fatigued.  She did have blood work several weeks ago and had normal hemoglobin on 08/04/2024.  She denies any chest pain, shortness of breath, palpitations.  She has not tried any over-the-counter medication for symptom management.  She is breast-feeding.    Past Medical History:  Diagnosis Date   Alpha thalassemia silent carrier 01/13/2020   Medical history non-contributory     Patient Active Problem List   Diagnosis Date Noted   PPH (postpartum hemorrhage) 03/22/2024   Acute blood loss anemia 03/22/2024   SVD (spontaneous vaginal delivery) 03/21/2024   Supervision of high risk pregnancy, antepartum 02/20/2024   Anemia in pregnancy 12/25/2023   Unwanted fertility 12/22/2023   History of prior pregnancy with IUGR newborn 10/12/2023   Vaginal bleeding in pregnancy, second trimester 09/24/2023   Alpha thalassemia silent carrier 01/13/2020   Language barrier 02/21/2019   History of preterm delivery     Past Surgical History:  Procedure Laterality Date    NO PAST SURGERIES     TUBAL LIGATION N/A 03/22/2024   Procedure: LIGATION, FALLOPIAN TUBE, POSTPARTUM;  Surgeon: Kandis Devaughn Sayres, MD;  Location: [facility];  Service: Gynecology;  Laterality: N/A;    OB History     Gravida  4   Para  4   Term  3   Preterm  1   AB  0   Living  4      SAB  0   IAB  0   Ectopic  0   Multiple  0   Live Births  4            Home Medications    Prior to Admission medications   Medication Sig Start Date End Date Taking? Authorizing Provider  norethindrone (AYGESTIN) 5 MG tablet Take 1 tablet (5 mg total) by mouth daily. 08/23/24  Yes Erinn Mendosa, Rocky POUR, PA-C  ferrous sulfate  325 (65 FE) MG EC tablet Take 1 tablet (325 mg total) by mouth daily with breakfast. Patient not taking: Reported on 05/02/2024 10/13/23   Kumar, Agnijita, MD  ibuprofen  (ADVIL ) 600 MG tablet Take 1 tablet (600 mg total) by mouth every 6 (six) hours. 03/23/24   Eveline Lynwood MATSU, MD  oxyCODONE  (OXY IR/ROXICODONE ) 5 MG immediate release tablet Take 1 tablet (5 mg total) by mouth every 6 (six) hours as needed (pain scale 4-7). Patient not taking: Reported on 05/02/2024 03/23/24   Eveline Lynwood MATSU, MD  polyethylene glycol (MIRALAX ) 17 g packet Take  17 g by mouth daily. 05/02/24   Davis, Devon E, PA-C  Prenatal Vit-Fe Fumarate-FA (PREPLUS) 27-1 MG TABS Take 1 tablet by mouth daily. 05/02/24   Davis, Devon E, PA-C    Family History Family History  Problem Relation Age of Onset   Healthy Father    Healthy Mother     Social History Social History   Tobacco Use   Smoking status: Never   Smokeless tobacco: Never  Vaping Use   Vaping status: Never Used  Substance Use Topics   Alcohol use: No   Drug use: Never     Allergies   Patient has no known allergies.   Review of Systems Review of Systems  Constitutional:  Positive for activity change and fatigue. Negative for appetite change and fever.  Respiratory:  Negative for shortness of breath.   Cardiovascular:   Negative for chest pain.  Gastrointestinal:  Negative for abdominal pain, diarrhea, nausea and vomiting.  Genitourinary:  Positive for menstrual problem and vaginal bleeding. Negative for dysuria, frequency, pelvic pain, urgency, vaginal discharge and vaginal pain.     Physical Exam Triage Vital Signs ED Triage Vitals  Encounter Vitals Group     BP 08/23/24 1923 113/72     Girls Systolic BP Percentile --      Girls Diastolic BP Percentile --      Boys Systolic BP Percentile --      Boys Diastolic BP Percentile --      Pulse Rate 08/23/24 1923 73     Resp 08/23/24 1923 16     Temp 08/23/24 1923 98 F (36.7 C)     Temp Source 08/23/24 1923 Oral     SpO2 08/23/24 1923 97 %     Weight --      Height --      Head Circumference --      Peak Flow --      Pain Score 08/23/24 1920 4     Pain Loc --      Pain Education --      Exclude from Growth Chart --    No data found.  Updated Vital Signs BP 113/72 (BP Location: Right Arm)   Pulse 73   Temp 98 F (36.7 C) (Oral)   Resp 16   SpO2 97%   Breastfeeding No   Visual Acuity Right Eye Distance:   Left Eye Distance:   Bilateral Distance:    Right Eye Near:   Left Eye Near:    Bilateral Near:     Physical Exam Vitals reviewed. Exam conducted with a chaperone present.  Constitutional:      General: She is awake. She is not in acute distress.    Appearance: Normal appearance. She is well-developed. She is not ill-appearing.     Comments: Very pleasant female appears stated age in no acute distress sitting comfortable in exam room  HENT:     Head: Normocephalic and atraumatic.  Cardiovascular:     Rate and Rhythm: Normal rate and regular rhythm.     Heart sounds: Normal heart sounds, S1 normal and S2 normal. No murmur heard. Pulmonary:     Effort: Pulmonary effort is normal.     Breath sounds: Normal breath sounds. No wheezing, rhonchi or rales.     Comments: Clear to auscultation bilaterally Abdominal:     General:  Bowel sounds are normal.     Palpations: Abdomen is soft.     Tenderness: There is no abdominal tenderness. There is  no right CVA tenderness, left CVA tenderness, guarding or rebound.  Genitourinary:    Labia:        Right: No rash or tenderness.        Left: No rash or tenderness.      Vagina: Bleeding present. No vaginal discharge.     Cervix: Cervical bleeding present. No cervical motion tenderness.     Uterus: Normal.      Adnexa: Right adnexa normal and left adnexa normal.       Right: No mass or tenderness.         Left: No mass or tenderness.       Comments: Copious dark blood noted posterior vaginal vault and in cervical os.  No adnexal tenderness or CMT tenderness.  Alyson, RT present as chaperone during exam. Psychiatric:        Behavior: Behavior is cooperative.      UC Treatments / Results  Labs (all labs ordered are listed, but only abnormal results are displayed) Labs Reviewed  CBC  COMPREHENSIVE METABOLIC PANEL WITH GFR  TSH  T4, FREE  POCT URINE PREGNANCY    EKG   Radiology No results found.  Procedures Procedures (including critical care time)  Medications Ordered in UC Medications - No data to display  Initial Impression / Assessment and Plan / UC Course  I have reviewed the triage vital signs and the nursing notes.  Pertinent labs & imaging results that were available during my care of the patient were reviewed by me and considered in my medical decision making (see chart for details).     Patient is well-appearing, afebrile, nontoxic, nontachycardic.  Urine pregnancy was negative.  Patient did have blood in vaginal vault but no evidence of hemorrhage on exam.  She was started on norethindrone 5 mg daily for 2 weeks and we discussed that this medication is generally considered safe with breast-feeding but it may decrease her supply.  She reports that she is supplementing with formula and we will just increase supplementation if needed.  We did  discuss that she will have withdrawal bleeding when she stops this medication and so the goal is to have her see OB/GYN before she runs out of medication.  Basic blood work including CBC, CMP, thyroid studies were obtained and are pending.  We will contact her if these are abnormal.  Recommend close follow-up with her OB/GYN and she will call them to schedule an appointment first thing next week.  We discussed that if anything worsens or changes and she has persistent heavy bleeding despite the medication, chest pain, shortness of breath, lightheadedness, weakness, syncopal episode she needs to be seen emergently.  Strict return precautions given.  All questions answered to patient and caregiver satisfaction.  Final Clinical Impressions(s) / UC Diagnoses   Final diagnoses:  Abnormal uterine bleeding (AUB)     Discharge Instructions      Your urine pregnancy test was negative.  I will contact you if any of your blood test is abnormal.  Start norethindrone daily to help with the bleeding.  Follow-up with your OB/GYN as soon as possible.  If you have any shortness of breath, heart racing, weakness, persistent heavy bleeding you need to go to the emergency room.     ED Prescriptions     Medication Sig Dispense Auth. Provider   norethindrone (AYGESTIN) 5 MG tablet Take 1 tablet (5 mg total) by mouth daily. 14 tablet Mattye Verdone K, PA-C      PDMP  not reviewed this encounter.   Sherrell Rocky POUR, PA-C 08/23/24 2024

## 2024-08-25 ENCOUNTER — Ambulatory Visit (HOSPITAL_COMMUNITY): Payer: Self-pay | Admitting: Physician Assistant

## 2024-08-26 ENCOUNTER — Telehealth: Payer: Self-pay

## 2024-08-27 ENCOUNTER — Ambulatory Visit: Admitting: Obstetrics and Gynecology

## 2024-08-27 VITALS — BP 108/73 | HR 80 | Wt 154.0 lb

## 2024-08-27 DIAGNOSIS — R7989 Other specified abnormal findings of blood chemistry: Secondary | ICD-10-CM | POA: Diagnosis not present

## 2024-08-27 DIAGNOSIS — Z603 Acculturation difficulty: Secondary | ICD-10-CM

## 2024-08-27 DIAGNOSIS — Z758 Other problems related to medical facilities and other health care: Secondary | ICD-10-CM | POA: Diagnosis not present

## 2024-08-27 DIAGNOSIS — N938 Other specified abnormal uterine and vaginal bleeding: Secondary | ICD-10-CM

## 2024-08-27 NOTE — Progress Notes (Signed)
   Subjective:    Patient ID: [name], female    DOB: July 15, 1996, 28 y.o.   MRN: [id number]  HPI 28 yo G4P4, SVD x 4, also with recent BTL seen for ER follow up with DUB.  Pt delivered 03/23/24.  She is breast and bottle feeding.  Pt had a period from 9/22 to present, stopping today with the use of aygestin.  Pt states the bleeding was fairly heavy, of note she recently received an elevated TSH which is new for her.   Review of Systems     Objective:   Physical Exam Vitals:   08/27/24 1542  BP: 108/73  Pulse: 80         Assessment & Plan:   1. Language barrier (Primary) Tele interpreter used  2. DUB (dysfunctional uterine bleeding) Will pursue expectant management for now, but patient advised to finish the aygestin.  Pt warned she would likely have a withdrawal bleed when stopping.  This was the first menses since delivery so a heavier than normal menses would be expected.  Will follow up in 4 months  3. Abnormal TSH TSH seen to be elevated which may signify hypothyroid.  Hypothyroid may also influence menstrual bleeding. Will refer to internal medicine for further eval and management.  - Ambulatory referral to Internal Medicine  I spent 20 minutes dedicated to the care of this patient including previsit review of records, face to face time with the patient discussing lab findings and post visit testing.   Jerilynn DELENA Buddle, MD Faculty Attending, [facility]

## 2024-08-27 NOTE — Progress Notes (Signed)
 Pt recently seen at Urgent Care for prolong vaginal bleeding.  Pt was given Aygestin and has helped with bleeding.  Pt is also still breastfeeding - mostly at night. Pt had a BTL with last delivery.

## 2024-09-01 DIAGNOSIS — Z419 Encounter for procedure for purposes other than remedying health state, unspecified: Secondary | ICD-10-CM | POA: Diagnosis not present

## 2024-10-02 DIAGNOSIS — Z419 Encounter for procedure for purposes other than remedying health state, unspecified: Secondary | ICD-10-CM | POA: Diagnosis not present
